# Patient Record
Sex: Female | Born: 1955 | Race: White | Hispanic: No | Marital: Married
Health system: Southern US, Community
[De-identification: ages and names within clinical notes are randomized; demographics above are authoritative.]

## PROBLEM LIST (undated history)

## (undated) DIAGNOSIS — E119 Type 2 diabetes mellitus without complications: Secondary | ICD-10-CM

---

## 2001-02-17 ENCOUNTER — Other Ambulatory Visit: Admission: RE | Admit: 2001-02-17 | Discharge: 2001-02-17 | Payer: Self-pay | Admitting: Obstetrics and Gynecology

## 2001-03-15 ENCOUNTER — Other Ambulatory Visit: Admission: RE | Admit: 2001-03-15 | Discharge: 2001-03-15 | Payer: Self-pay | Admitting: Obstetrics and Gynecology

## 2001-03-15 ENCOUNTER — Encounter (INDEPENDENT_AMBULATORY_CARE_PROVIDER_SITE_OTHER): Payer: Self-pay | Admitting: Specialist

## 2003-07-23 ENCOUNTER — Other Ambulatory Visit: Admission: RE | Admit: 2003-07-23 | Discharge: 2003-07-23 | Payer: Self-pay | Admitting: Obstetrics and Gynecology

## 2005-02-23 ENCOUNTER — Observation Stay: Payer: Self-pay | Admitting: Internal Medicine

## 2005-06-04 ENCOUNTER — Ambulatory Visit: Payer: Self-pay | Admitting: Specialist

## 2005-06-05 ENCOUNTER — Ambulatory Visit: Payer: Self-pay | Admitting: Specialist

## 2005-06-29 ENCOUNTER — Ambulatory Visit: Payer: Self-pay | Admitting: Specialist

## 2005-10-27 ENCOUNTER — Ambulatory Visit: Payer: Self-pay | Admitting: Obstetrics and Gynecology

## 2005-12-30 ENCOUNTER — Ambulatory Visit: Payer: Self-pay | Admitting: Unknown Physician Specialty

## 2007-10-27 ENCOUNTER — Ambulatory Visit: Payer: Self-pay | Admitting: Internal Medicine

## 2008-05-31 ENCOUNTER — Ambulatory Visit: Payer: Self-pay | Admitting: Gastroenterology

## 2008-11-19 ENCOUNTER — Observation Stay: Payer: Self-pay | Admitting: Vascular Surgery

## 2009-11-12 ENCOUNTER — Ambulatory Visit: Payer: Self-pay | Admitting: Gastroenterology

## 2009-11-12 DIAGNOSIS — E669 Obesity, unspecified: Secondary | ICD-10-CM | POA: Insufficient documentation

## 2009-11-12 DIAGNOSIS — N200 Calculus of kidney: Secondary | ICD-10-CM | POA: Insufficient documentation

## 2009-11-12 DIAGNOSIS — K5900 Constipation, unspecified: Secondary | ICD-10-CM | POA: Insufficient documentation

## 2009-11-12 DIAGNOSIS — R1032 Left lower quadrant pain: Secondary | ICD-10-CM | POA: Insufficient documentation

## 2009-11-12 DIAGNOSIS — M129 Arthropathy, unspecified: Secondary | ICD-10-CM | POA: Insufficient documentation

## 2009-11-12 DIAGNOSIS — K625 Hemorrhage of anus and rectum: Secondary | ICD-10-CM | POA: Insufficient documentation

## 2009-11-12 LAB — CONVERTED CEMR LAB
ALT: 12 units/L (ref 0–35)
Alkaline Phosphatase: 51 units/L (ref 39–117)
Basophils Absolute: 0 10*3/uL (ref 0.0–0.1)
Bilirubin, Direct: 0 mg/dL (ref 0.0–0.3)
CO2: 29 meq/L (ref 19–32)
Chloride: 106 meq/L (ref 96–112)
Creatinine, Ser: 0.9 mg/dL (ref 0.4–1.2)
Eosinophils Relative: 6.9 % — ABNORMAL HIGH (ref 0.0–5.0)
Hemoglobin: 12.8 g/dL (ref 12.0–15.0)
Lymphocytes Relative: 30.3 % (ref 12.0–46.0)
Monocytes Relative: 6.9 % (ref 3.0–12.0)
Neutro Abs: 3.1 10*3/uL (ref 1.4–7.7)
RDW: 13.2 % (ref 11.5–14.6)
Saturation Ratios: 15.7 % — ABNORMAL LOW (ref 20.0–50.0)
Sodium: 142 meq/L (ref 135–145)
Total Protein: 7 g/dL (ref 6.0–8.3)
Transferrin: 294.9 mg/dL (ref 212.0–360.0)
WBC: 5.6 10*3/uL (ref 4.5–10.5)

## 2009-11-19 ENCOUNTER — Ambulatory Visit: Payer: Self-pay | Admitting: Cardiovascular Disease

## 2009-12-03 ENCOUNTER — Ambulatory Visit: Payer: Self-pay | Admitting: Gastroenterology

## 2009-12-23 ENCOUNTER — Ambulatory Visit: Payer: Self-pay | Admitting: Gastroenterology

## 2009-12-24 ENCOUNTER — Telehealth: Payer: Self-pay | Admitting: Gastroenterology

## 2010-06-19 ENCOUNTER — Ambulatory Visit: Payer: Self-pay | Admitting: Unknown Physician Specialty

## 2010-07-09 ENCOUNTER — Encounter: Payer: Self-pay | Admitting: Internal Medicine

## 2010-07-09 ENCOUNTER — Ambulatory Visit: Payer: Self-pay | Admitting: Gastroenterology

## 2010-07-10 ENCOUNTER — Ambulatory Visit: Payer: Self-pay | Admitting: Internal Medicine

## 2010-07-10 DIAGNOSIS — J984 Other disorders of lung: Secondary | ICD-10-CM

## 2010-07-10 DIAGNOSIS — T17908A Unspecified foreign body in respiratory tract, part unspecified causing other injury, initial encounter: Secondary | ICD-10-CM | POA: Insufficient documentation

## 2010-07-10 DIAGNOSIS — R05 Cough: Secondary | ICD-10-CM

## 2011-01-06 NOTE — Assessment & Plan Note (Signed)
Summary: Pulmonary consultation/ cough p peanut asp   Visit Type:  Initial Consult Copy to:  Self Primary Provider/Referring Provider:  none   History of Present Illness: 47 yowf never smoked with passive exposure to cigarettes and lots of bronchitis as child but outgrew it around 17 and fine since then until ? asp peanut.  July 10, 2010  1st pulmonary office eval cc cough acute onset  7/13 after trying to eat chocolate covered peanut  while riding in car back from church and thought she coughed it up but couldn't use voice and every time she does starts coughing again.  Eval by ENT Jenne [facility] and looked at throat saw red throat rx with 10 days augment and 10 days of prednsione > some better,  still much worse with voice use.  no noct symptoms at all.  Pt denies any significant sore throat, dysphagia, itching, sneezing,  nasal congestion or excess secretions,  fever, chills, sweats, unintended wt loss, pleuritic or exertional cp, hempoptysis, change in activity tolerance  orthopnea pnd or leg swelling.  coughs to point of loosing voice.  Current Medications (verified): 1)  Estradiol 2 Mg Tabs (Estradiol) .Marland Kitchen.. 1 Once Daily 2)  Ibuprofen 800 Mg Tabs (Ibuprofen) .... Three Times A Day Prn 3)  Tessalon Perles 100 Mg Caps (Benzonatate) .Marland Kitchen.. 1 Every 8 Hrs As Needed 4)  Zinc 50 Mg Tabs (Zinc) .Marland Kitchen.. 1 Two Times A Day 5)  Vitamin C 500 Mg Tabs (Ascorbic Acid) .Marland Kitchen.. 1 Two Times A Day 6)  L-Lysine 500 Mg Tabs (Lysine) .Marland Kitchen.. 1 Two Times A Day  Allergies (verified): 1)  ! Codeine  Past History:  Past Medical History:  FOREIGN BODY, ASPIRATION (ICD-934.9) PULMONARY NODULE (ICD-518.89)    - CT chest Erwin NAD 07/09/10 RECTAL BLEEDING (ICD-569.3) CONSTIPATION (ICD-564.00) ABDOMINAL PAIN, LEFT LOWER QUADRANT (ICD-789.04) OBESITY (ICD-278.00) ARTHRITIS (ICD-716.90) RENAL CALCULUS (ICD-592.0)  Family History: Family History of Breast Cancer:Mother Family History of Colon Cancer:Maternal  Grandfaher Family History of Diabetes: Mother Family History of Heart Disease: Father Lymphoma- Mother  Review of Systems       The patient complains of shortness of breath with activity, non-productive cough, and loss of appetite.  The patient denies shortness of breath at rest, productive cough, coughing up blood, chest pain, irregular heartbeats, acid heartburn, indigestion, weight change, abdominal pain, difficulty swallowing, sore throat, tooth/dental problems, headaches, nasal congestion/difficulty breathing through nose, sneezing, itching, ear ache, anxiety, depression, hand/feet swelling, joint stiffness or pain, rash, change in color of mucus, and fever.    Vital Signs:  Patient profile:   55 year old female Weight:      211.50 pounds O2 Sat:      97 % on Room air Temp:     98.0 degrees F oral Pulse rate:   74 / minute BP sitting:   110 / 70  (left arm)  Vitals Entered By: Vernie Murders (July 10, 2010 1:58 PM)  O2 Flow:  Room air  Physical Exam  Additional Exam:  wt 206 > 211 July 10, 2010  obese hoarse wf with frequent throat clearing and pseudowheeze resolves with purse lip maneuver  HEENT: nl dentition, turbinates, and orophanx. Nl external ear canals without cough reflex NECK :  without JVD/Nodes/TM/ nl carotid upstrokes bilaterally LUNGS: no acc muscle use, clear to A and P bilaterally without cough on insp or exp maneuvers CV:  RRR  no s3 or murmur or increase in P2, no edema  ABD:  soft and  nontender with nl excursion in the supine position. No bruits or organomegaly, bowel sounds nl MS:  warm without deformities, calf tenderness, cyanosis or clubbing SKIN: warm and dry without lesions   NEURO:  alert, approp, no deficits     CXR  Procedure date:  07/09/2010  Findings:      Reviewed:  NAD  Impression & Recommendations:  Problem # 1:  COUGH (ICD-786.2)  Of the three most common causes of chronic cough, only one (GERD) can actually cause the other two  (asthma and pnds) and perpetuate the cylce of cough inducing airway trauma, inflammation, heightened sensitivity to reflux which is prompted by the cough itself via a cyclical mechanism.  This may partially respond to steroids and look like asthma and post nasal drainage but never erradicated completely unless the cough and the secondary reflux are eliminated, preferably both at the same time.   See instructions for specific recommendations  - if not better then fob to r/o retained fb, but this is very unlikely given absence of findings to support this dx by exam and absence of symptoms x when uses voice (no noct or early am cough or wheeze)  Orders: Consultation Level V ([address]) New Patient Level V ([address])  Medications Added to Medication List This Visit: 1)  Estradiol 2 Mg Tabs (Estradiol) .Marland Kitchen.. 1 once daily 2)  Tessalon Perles 100 Mg Caps (Benzonatate) .Marland Kitchen.. 1 every 8 hrs as needed 3)  Zinc 50 Mg Tabs (Zinc) .Marland Kitchen.. 1 two times a day 4)  Vitamin C 500 Mg Tabs (Ascorbic acid) .Marland Kitchen.. 1 two times a day 5)  L-lysine 500 Mg Tabs (Lysine) .Marland Kitchen.. 1 two times a day 6)  Tramadol Hcl 50 Mg Tabs (Tramadol hcl) .... One to two by mouth every 4-6 hours 7)  Prednisone 10 Mg Tabs (Prednisone) .... 4 each am x 2days, 2x2days, 1x2days and stop  Patient Instructions: 1)  Prednisone  10mg  4 each am x 2days, 2x2days, 1x2days and stop  2)  Take delsym two tsp every 12 hours and add tramadol 50 mg up to every 4 hours to suppress the urge to cough. Swallowing water or using ice chips/non mint and menthol containing candies (such as lifesavers or sugarless jolly ranchers) are also effective.  3)  Acid reflux is a leading suspect here and needs to be eliminated  completely before considering additional studies or treatment options. To suppress this maximally, take dexilant 60 mg 30 min before first meal and pepcid 20 mg (otc) at bedtime plus diet measures as listed in flyer 4)  Call me August 8th to schedule bronchoscopy if  not better Prescriptions: PREDNISONE 10 MG  TABS (PREDNISONE) 4 each am x 2days, 2x2days, 1x2days and stop  #14 x 0   Entered and Authorized by:   Nyoka Cowden MD   Signed by:   Nyoka Cowden MD on 07/10/2010   Method used:   Electronically to        [facility] [phone]* (retail)       [address] [place], [place]  [address]       Ph: [phone]       Fax: [phone]   RxID:   [id number] TRAMADOL HCL 50 MG  TABS (TRAMADOL HCL) One to two by mouth every 4-6 hours  #40 x 0   Entered and Authorized by:   Nyoka Cowden MD   Signed by:   Nyoka Cowden MD on  07/10/2010   Method used:   Electronically to        [facility] [phone]* (retail)       [address] [place], [place]  [address]       Ph: [phone]       Fax: [phone]   RxID:   [id number]

## 2011-01-06 NOTE — Procedures (Signed)
Summary: Colonoscopy  Patient: Nyasha Rahilly Note: All result statuses are Final unless otherwise noted.  Tests: (1) Colonoscopy (COL)   COL Colonoscopy           DONE     [facility]     [address]     [place], [place]  [address]           COLONOSCOPY PROCEDURE REPORT           PATIENT:  Shravya, Wickwire  MR#:  [id number]     BIRTHDATE:  1956-09-16, 53 yrs. old  GENDER:  female           ENDOSCOPIST:  Vania Rea. Jarold Motto, MD, [facility]     Referred by:           PROCEDURE DATE:  12/23/2009     PROCEDURE:  Colonoscopy, Diagnostic, Colon w/ inj sclerosis of     hemorrhoids     ASA CLASS:  Class II     INDICATIONS:  abdominal pain, hematochezia           MEDICATIONS:   Fentanyl 50 mcg IV, Versed 7 mg IV           DESCRIPTION OF PROCEDURE:   After the risks benefits and     alternatives of the procedure were thoroughly explained, informed     consent was obtained.  Digital rectal exam was performed and     revealed no abnormalities and perianal skin tags.   The LB     [id number] endoscope was introduced through the anus and     advanced to the cecum, which was identified by both the appendix     and ileocecal valve, without limitations.  The quality of the prep     was Moviprep fair.  The instrument was then slowly withdrawn as     the colon was fully examined.     <<PROCEDUREIMAGES>>           FINDINGS:  ENDOSCOPIC FINDINGS:   Internal hemorrhoids were found.     CC OF HYPERTONIC SALINE INJECTED ABOVE THE Z-LINE. No polyps or     cancers were seen.  This was otherwise a normal examination of the     colon.   Retroflexed views in the rectum revealed hemorrhoids.     The scope was then withdrawn from the patient and the procedure     completed.           COMPLICATIONS:  None           ENDOSCOPIC IMPRESSION:     1) No polyps or cancers     2) Internal hemorrhoids     3) Hemorrhoids     CHRONIC ABDOMINAL PAIN.?? ETIOLOGY.NORMAL CT SCAN AND     COLONOSCOPY  RECOMMENDATIONS:     1.ANNUSOL HC SUPP BID FOR 1 WEEK.     2.GYN F/U DR. Melinda Crutch OF ENDOMETRIOSIS AND TAH.PROBABLY NEEDS     LAPROSCOPY EXAM.     3. THIS IS HER SECOND COLONOSCOPY IN A YEAR.NOFURTHER GI W/U     INDICATED.           REPEAT EXAM:  No           ______________________________     Vania Rea. Jarold Motto, MD, [facility]           CC:           n.     eSIGNED:   Vania Rea. Patterson at 12/23/2009 12:31 PM  [place], [id number]  Note: An exclamation mark (!) indicates a result that was not dispersed into the flowsheet. Document Creation Date: 12/23/2009 12:31 PM _______________________________________________________________________  (1) Order result status: Final Collection or observation date-time: 12/23/2009 12:15 Requested date-time:  Receipt date-time:  Reported date-time:  Referring Physician:   Ordering Physician: Sheryn Bison [id number]) Specimen Source:  Source: Launa Grill Order Number: [id number] Lab site:

## 2011-01-06 NOTE — Progress Notes (Signed)
Summary: triage  Phone Note Other Incoming Call back at [phone]   Caller: Patient Call For: Jarold Motto Reason for Call: Talk to Nurse Summary of Call: Patient states that her stomach feels really tight wants to know what to do had colon yesterday. Initial call taken by: Tawni Levy,  December 24, 2009 2:31 PM  Follow-up for Phone Call        pt. c/o abd. being tight today "like pregnant". Not as painful as yesterday but uncomfortable ( 2 on pain scale) . has eaten cereal and ham sandwich today and has passed some flatus but not much. went over all the suggestions for warm fluids ,walking etc. to releive gas discomfort with pt. and pt. says she has done these things and has taken gas x tablets also. called and informed Lupita Leash ,Dr. Norval Gable nurse to let Dr. Jarold Motto know pt's c/os.   this he is a chronic problem and is not acute. She should continue to use p.r.n. Bentyl Follow-up by: Alease Frame RN,  December 24, 2009 2:51 PM  Additional Follow-up for Phone Call Additional follow up Details #1::        as needed bentyl Additional Follow-up by: Mardella Layman MD FACG,  December 24, 2009 4:11 PM     Appended Document: QI Follow-UP    Phone Note Outgoing Call   Call placed by: Elmon Kirschner RN,  January 16, 2010 9:53 AM Details for Reason: Pain Complaint QI follow up. Summary of Call: Patient had complaint of RUQ pain post-procedure in recovery (had hemorrhoidal injection - rated RUQ abd. pain from 9 down to 5 before D/C).  She related that she was very dissappointed that no one returned her calls.  The only documented call was from 12/24/2009, she called with c/o abd. being tight, "like pregnant" which she rated as a 2 on the pain scale. The call was followed up by Alease Frame, RN who went over all of the "routine" suggestions to relieve gas discomfort. Pt said she had done all of those things and taken GAS-X tabs as well without relief. Boyd Kerbs referred the c/o to [facility] 3rd  floor to relate to Dr. Jarold Motto.  According to phone note Dr. Jarold Motto said that her c/o was a chronic problem and she should continue Bentyl. There is no documentation of anyone following up with a phone call to the patient.  The patient related that she had a colonoscopy previously in [place] by Dr. Daleen Squibb and had no problems post-procedure.  She stated she had a rough week following this colonoscopy and had to manage her care all on her own because no one returned her calls.  Today (2/10) she reports her pain has resolved, she attributes this to taking Senna daily for her c/o constipation which allows her to have BM every 2-3 days.  Follow-up for Phone Call        Noted.  Please see Lupita Leash re this. Follow-up by: Ashok Cordia RN,  February 06, 2010 10:17 AM  Additional Follow-up for Phone Call Additional follow up Details #1::        noted....needs gyn f/u in future if she calls....  Additional Follow-up by: Mardella Layman MD FACG,  February 24, 2010 9:03 AM    Additional Follow-up for Phone Call Additional follow up Details #2::    nancy, please see Lupita Leash about this note. Follow-up by: Ashok Cordia RN,  February 25, 2010 4:21 PM

## 2011-01-06 NOTE — Miscellaneous (Signed)
Summary: RR med  Clinical Lists Changes  Medications: Added new medication of ANUSOL-HC 25 MG  SUPP (HYDROCORTISONE ACETATE) Use rectally twice daily as needed for hemorrhoid relief - Signed Rx of ANUSOL-HC 25 MG  SUPP (HYDROCORTISONE ACETATE) Use rectally twice daily as needed for hemorrhoid relief;  #30 x 3;  Signed;  Entered by: Clide Cliff RN;  Authorized by: Mardella Layman MD [facility];  Method used: Electronically to [facility] [phone]*, [address], [place], [place]  [address], Ph: [phone], Fax: [phone] Observations: Added new observation of ALLERGY REV: Done (12/23/2009 12:39)    Prescriptions: ANUSOL-HC 25 MG  SUPP (HYDROCORTISONE ACETATE) Use rectally twice daily as needed for hemorrhoid relief  #30 x 3   Entered by:   Clide Cliff RN   Authorized by:   Mardella Layman MD [facility]   Signed by:   Clide Cliff RN on 12/23/2009   Method used:   Electronically to        [facility] [phone]* (retail)       [address] [place], [place]  [address]       Ph: [phone]       Fax: [phone]   RxID:   [id number]

## 2011-11-07 ENCOUNTER — Ambulatory Visit: Payer: Self-pay | Admitting: Oncology

## 2011-11-24 ENCOUNTER — Ambulatory Visit: Payer: Self-pay | Admitting: Gastroenterology

## 2011-11-25 ENCOUNTER — Ambulatory Visit: Payer: Self-pay | Admitting: Oncology

## 2011-11-26 ENCOUNTER — Ambulatory Visit: Payer: Self-pay | Admitting: Gastroenterology

## 2011-11-30 LAB — PATHOLOGY REPORT

## 2011-12-08 ENCOUNTER — Ambulatory Visit: Payer: Self-pay | Admitting: Oncology

## 2011-12-16 LAB — PROTIME-INR: Prothrombin Time: 29.2 secs — ABNORMAL HIGH (ref 11.5–14.7)

## 2011-12-23 LAB — PROTIME-INR: INR: 3.1

## 2011-12-30 LAB — PROTIME-INR: Prothrombin Time: 31.4 secs — ABNORMAL HIGH (ref 11.5–14.7)

## 2012-01-08 ENCOUNTER — Ambulatory Visit: Payer: Self-pay | Admitting: Oncology

## 2012-01-21 LAB — PROTIME-INR: Prothrombin Time: 34 secs — ABNORMAL HIGH (ref 11.5–14.7)

## 2012-02-05 ENCOUNTER — Ambulatory Visit: Payer: Self-pay | Admitting: Oncology

## 2012-02-24 LAB — PROTIME-INR
INR: 3.4
Prothrombin Time: 34.3 secs — ABNORMAL HIGH (ref 11.5–14.7)

## 2012-03-02 LAB — PROTIME-INR: INR: 1.2

## 2012-03-07 ENCOUNTER — Ambulatory Visit: Payer: Self-pay | Admitting: Oncology

## 2012-03-16 ENCOUNTER — Ambulatory Visit: Payer: Self-pay | Admitting: Oncology

## 2012-03-16 LAB — COMPREHENSIVE METABOLIC PANEL
Albumin: 3.8 g/dL (ref 3.4–5.0)
Alkaline Phosphatase: 75 U/L (ref 50–136)
BUN: 21 mg/dL — ABNORMAL HIGH (ref 7–18)
Creatinine: 0.95 mg/dL (ref 0.60–1.30)
SGOT(AST): 15 U/L (ref 15–37)
Total Protein: 7 g/dL (ref 6.4–8.2)

## 2012-03-16 LAB — CBC CANCER CENTER
Basophil #: 0 x10 3/mm (ref 0.0–0.1)
HCT: 38.6 % (ref 35.0–47.0)
HGB: 12.6 g/dL (ref 12.0–16.0)
Lymphocyte %: 29.6 %
MCHC: 32.6 g/dL (ref 32.0–36.0)
MCV: 86 fL (ref 80–100)
Monocyte %: 7.5 %
Neutrophil #: 1.7 x10 3/mm (ref 1.4–6.5)
Platelet: 218 x10 3/mm (ref 150–440)
RBC: 4.52 10*6/uL (ref 3.80–5.20)
RDW: 15.6 % — ABNORMAL HIGH (ref 11.5–14.5)
WBC: 3.5 x10 3/mm — ABNORMAL LOW (ref 3.6–11.0)

## 2012-04-06 ENCOUNTER — Ambulatory Visit: Payer: Self-pay | Admitting: Oncology

## 2013-04-28 IMAGING — CT CT ABD-PELV W/ CM
1 of 2 series · 15 of 32 positions shown, 19 images · IV contrast (isovue)
Comparison: none

REASON FOR EXAM: STAT CR [id number]  abd pain nausea LLQ pain  eval acute
diverticulitis eval...
COMMENTS:

PROCEDURE:     CT  - CT ABDOMEN / PELVIS  W  - November 24, 2011  [DATE]
RESULT:     Comparison:  11/19/2008
TECHNIQUE: Multiple axial images of the abdomen and pelvis were performed
from the lung bases to the pubic symphysis, with p.o. contrast and with 85
mL of Isovue 370 intravenous contrast.

[Series 2: 3mm soft tissue · axial · 0.71mm/px · z∈[-468,-40]mm · 15 of 157 slices shown, 19 images]
[im 7/157  soft-tissue]
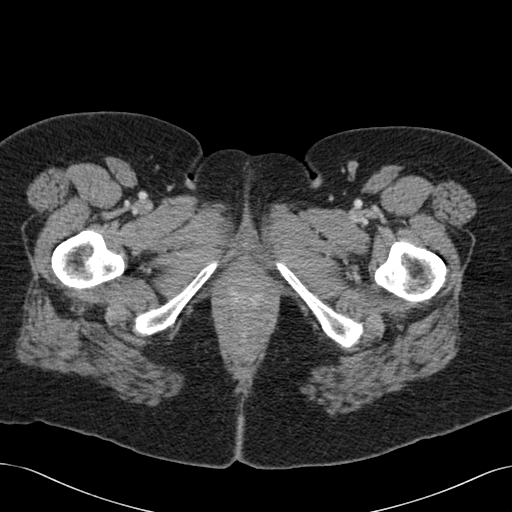
[im 7/157  bone]
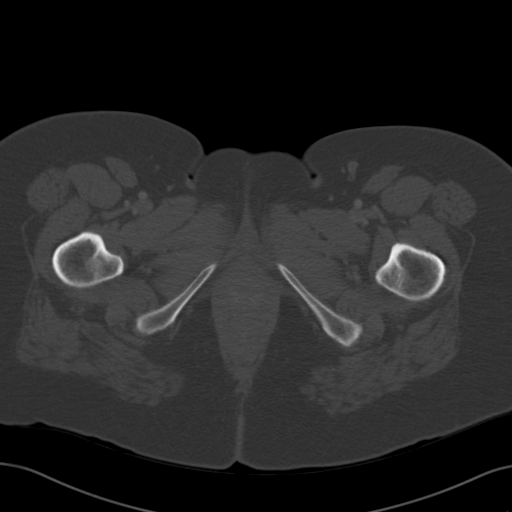
[im 20/157  soft-tissue]
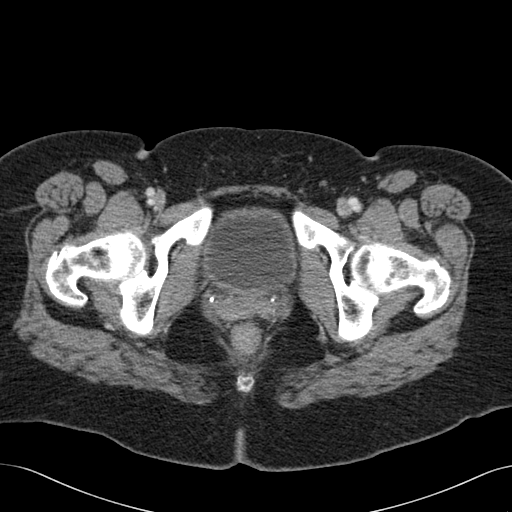
[im 33/157  soft-tissue]
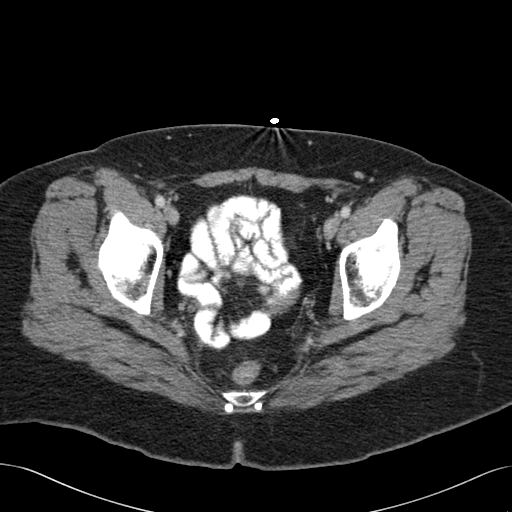
[im 46/157  soft-tissue]
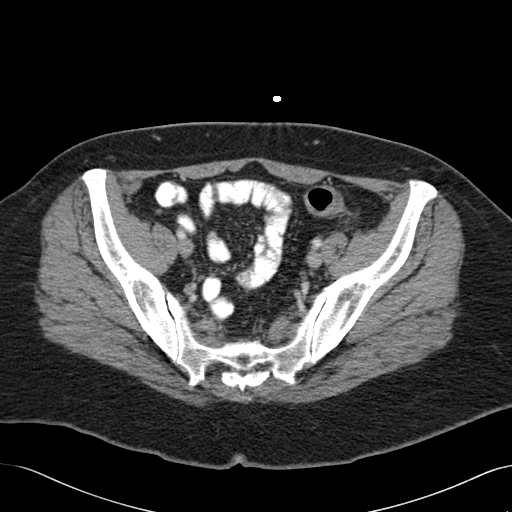
[im 53/157  soft-tissue]
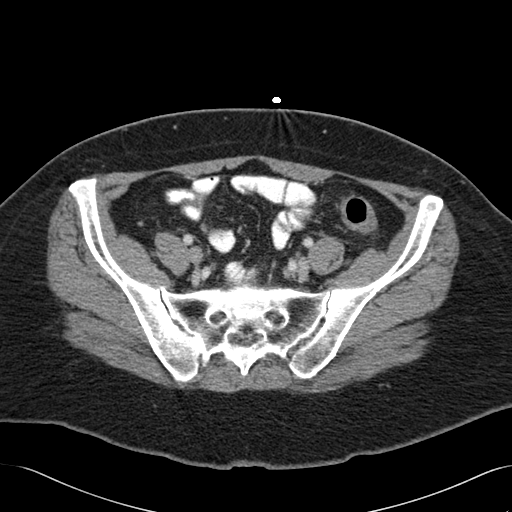
[im 66/157  soft-tissue]
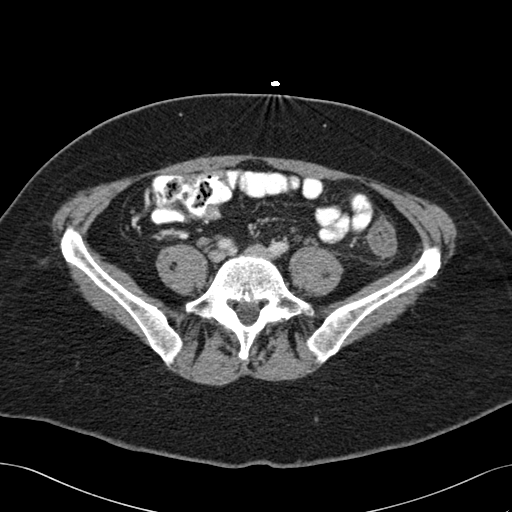
[im 79/157  soft-tissue]
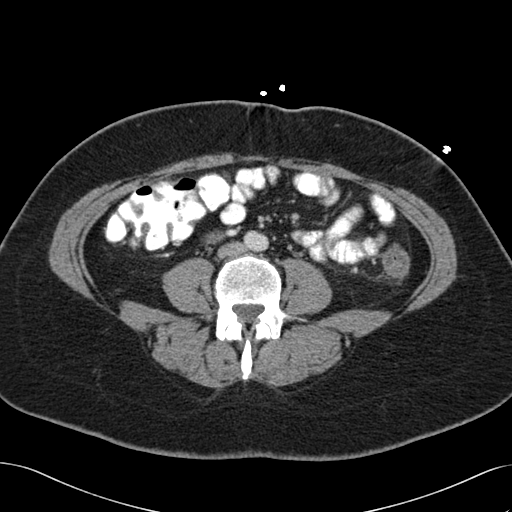
[im 92/157  soft-tissue]
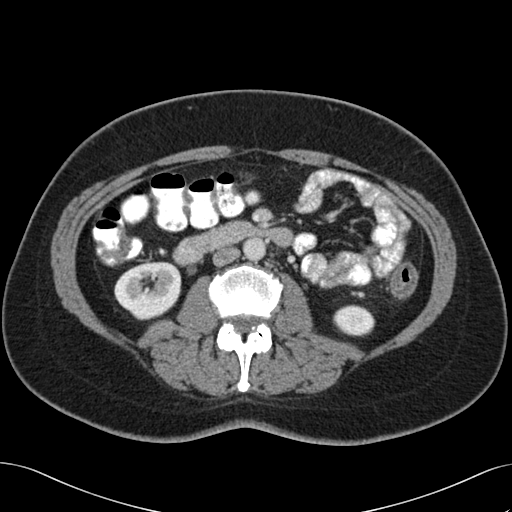
[im 105/157  soft-tissue]
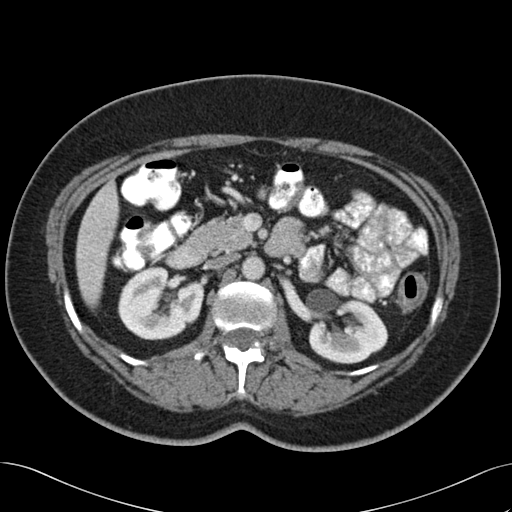
[im 105/157  bone]
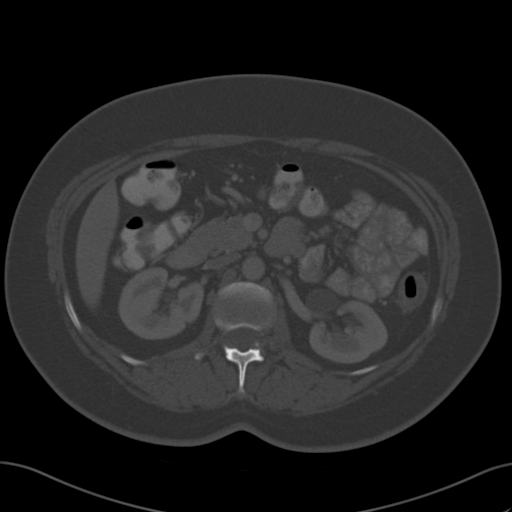
[im 111/157  soft-tissue]
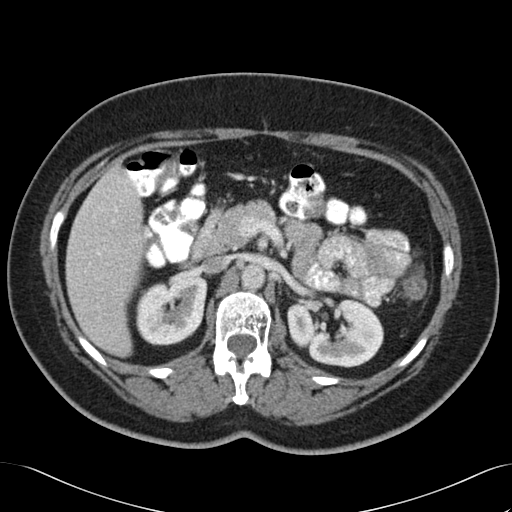
[im 124/157  soft-tissue]
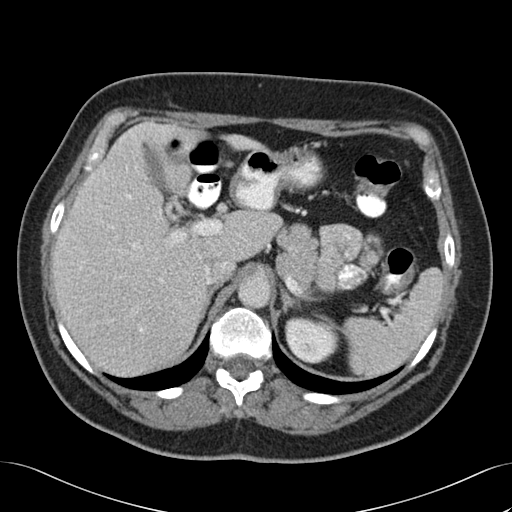
[im 131/157  lung]
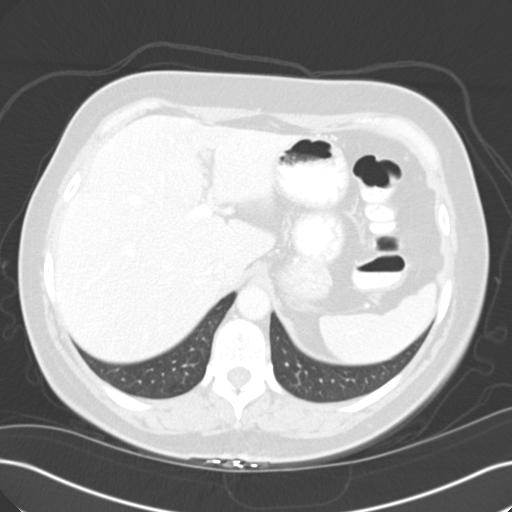
[im 137/157  soft-tissue]
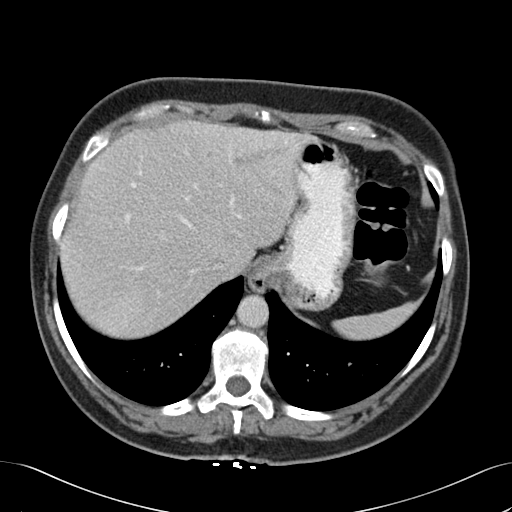
[im 137/157  lung]
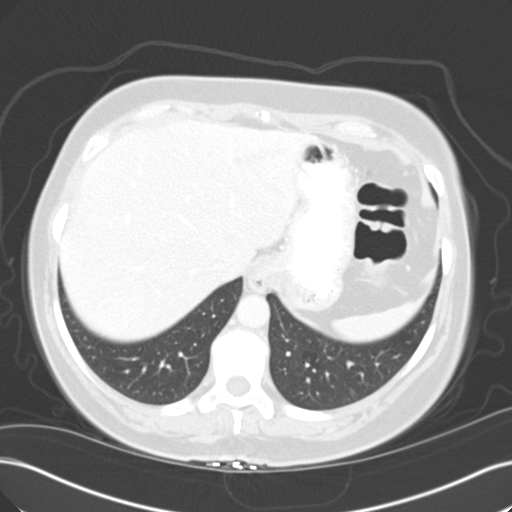
[im 144/157  lung]
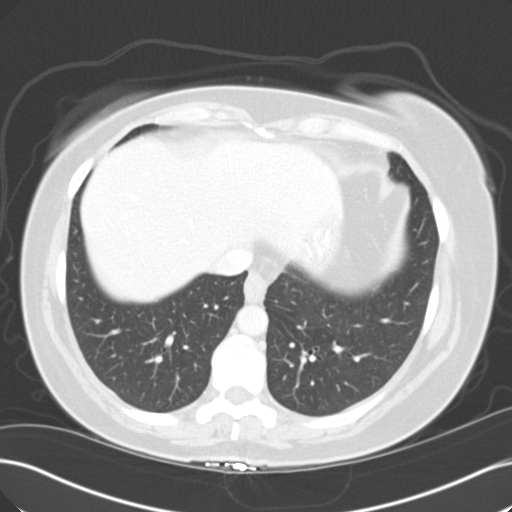
[im 150/157  soft-tissue]
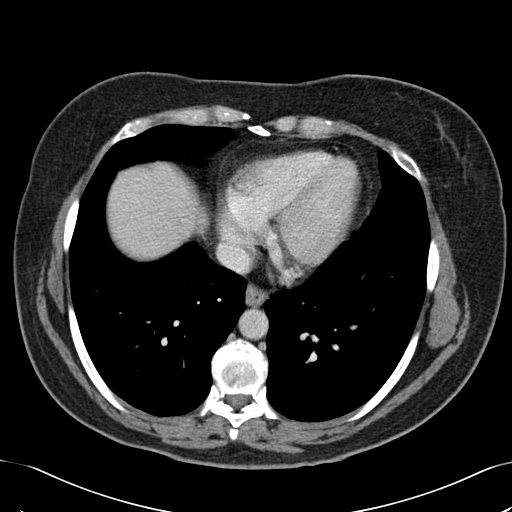
[im 150/157  lung]
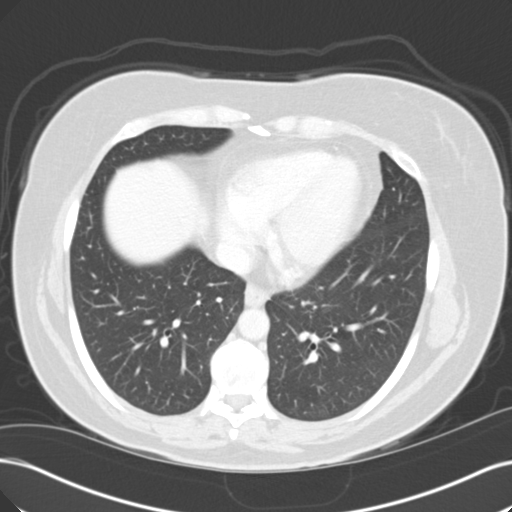

[15 of 32 positions shown; findings below may reference images not displayed]

FINDINGS: The left portal vein is filled with low attenuation and there is branching
low-attenuation in the expected location of the left portal vein. Findings
are concerning for thrombosis of the left portal vein. Minimal
low-attenuation along the falciform ligament may be related to focal fatty
deposition versus perfusional changes related to the left portal vein
thrombosis. Triangular area of increased attenuation in the right hepatic
lobe likely secondary to perfusion anomaly. The spleen, adrenals, and
gallbladder are unremarkable. The kidneys and pancreas are unremarkable.

The small and large bowel are normal in caliber. There is circumferential
bowel wall thickening of the descending colon, with mild adjacent stranding.
Prominence of the bowel wall of the sigmoid colon is felt to be secondary to
underdistention. The appendix is normal. No extraluminal fluid collections
identified. The patient is status post hysterectomy.

No aggressive lytic or sclerotic osseous lesions are identified.
IMPRESSION: 1. Colitis involving the descending colon. Differential includes infectious,
inflammatory, and ischemic etiologies.
2. Thrombosis of the left portal vein, new from prior. The etiology of this
thrombosis is not identified on this study. The left portal vein appears
expanded with low-attenuation material, suggesting this is an acute
thrombosis. Followup CT or MRI is suggested.

This was called to Laaouina Tiger at 3393 hours 11/24/2011.

## 2014-02-19 DIAGNOSIS — R079 Chest pain, unspecified: Secondary | ICD-10-CM | POA: Insufficient documentation

## 2014-02-19 DIAGNOSIS — I8290 Acute embolism and thrombosis of unspecified vein: Secondary | ICD-10-CM | POA: Insufficient documentation

## 2014-02-19 DIAGNOSIS — F32A Depression, unspecified: Secondary | ICD-10-CM | POA: Insufficient documentation

## 2014-02-19 DIAGNOSIS — F329 Major depressive disorder, single episode, unspecified: Secondary | ICD-10-CM | POA: Insufficient documentation

## 2015-03-08 DIAGNOSIS — M4726 Other spondylosis with radiculopathy, lumbar region: Secondary | ICD-10-CM | POA: Insufficient documentation

## 2015-04-05 DIAGNOSIS — M461 Sacroiliitis, not elsewhere classified: Secondary | ICD-10-CM | POA: Insufficient documentation

## 2015-08-01 ENCOUNTER — Encounter: Payer: Self-pay | Admitting: Gastroenterology

## 2016-08-17 ENCOUNTER — Encounter: Payer: Self-pay | Admitting: Podiatry

## 2016-08-17 ENCOUNTER — Ambulatory Visit (INDEPENDENT_AMBULATORY_CARE_PROVIDER_SITE_OTHER): Payer: Managed Care, Other (non HMO) | Admitting: Podiatry

## 2016-08-17 ENCOUNTER — Ambulatory Visit (INDEPENDENT_AMBULATORY_CARE_PROVIDER_SITE_OTHER): Payer: Managed Care, Other (non HMO)

## 2016-08-17 DIAGNOSIS — M7752 Other enthesopathy of left foot: Secondary | ICD-10-CM | POA: Diagnosis not present

## 2016-08-17 DIAGNOSIS — M79672 Pain in left foot: Secondary | ICD-10-CM

## 2016-08-17 DIAGNOSIS — M778 Other enthesopathies, not elsewhere classified: Secondary | ICD-10-CM

## 2016-08-17 DIAGNOSIS — M779 Enthesopathy, unspecified: Secondary | ICD-10-CM

## 2016-08-17 MED ORDER — MELOXICAM 15 MG PO TABS: 15.0000 mg | ORAL_TABLET | Freq: Every day | ORAL | 3 refills | Status: DC

## 2016-08-17 MED ORDER — METHYLPREDNISOLONE 4 MG PO TBPK: ORAL_TABLET | ORAL | 0 refills | Status: DC

## 2016-08-17 NOTE — Progress Notes (Signed)
She presents today as a new patient with a chief complaint of pain to the left forefoot concerned that it may possibly be a neuroma since June 2017. She states that this seems to be getting worse she's been taking Aleve to no avail she tried heat and ice and elevating her foot. 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 She denies trauma.  Objective: Vital signs are stable she is alert and oriented 3. Pulses are palpable. Since her was intact for since was the monofilament. Deep tendon reflexes are intact and muscle strength was 5 over 5 dorsiflexion plantar flexors and inverters everters all intrinsic musculature is intact. Orthopedic evaluation of his rates all joints distal to the ankle for range of motion without crepitation. Cheney's evaluation of a straight supple well-hydrated cutis she has pain on palpation second and third metatarsophalangeal joints of the left foot at the end range of motion. Radiographs do not demonstrate any type of osseus abnormalities.  Assessment: Capsulitis second third metatarsophalangeal joints of the left foot.  Plan: I injected between the second and third metatarsals today with Kenalog and local anesthetic started her on a Medrol Dosepak to be followed by meloxicam. discussed appropriate shoe gear stretching exercises ice therapy and shoe modifications.

## 2016-09-16 ENCOUNTER — Ambulatory Visit: Payer: Managed Care, Other (non HMO) | Admitting: Podiatry

## 2017-03-01 ENCOUNTER — Ambulatory Visit (INDEPENDENT_AMBULATORY_CARE_PROVIDER_SITE_OTHER): Payer: Self-pay | Admitting: Podiatry

## 2017-03-01 ENCOUNTER — Encounter: Payer: Self-pay | Admitting: Podiatry

## 2017-03-01 DIAGNOSIS — M7752 Other enthesopathy of left foot: Secondary | ICD-10-CM

## 2017-03-01 DIAGNOSIS — M778 Other enthesopathies, not elsewhere classified: Secondary | ICD-10-CM

## 2017-03-01 DIAGNOSIS — M779 Enthesopathy, unspecified: Principal | ICD-10-CM

## 2017-03-01 DIAGNOSIS — M79673 Pain in unspecified foot: Secondary | ICD-10-CM

## 2017-03-01 NOTE — Progress Notes (Signed)
She presents today for follow-up of her capsulitis second metatarsophalangeal joint of the left foot. States that he was doing very well for quite some time now has began to hurt again and she is noticing it definitive shift of the second toe medially toward the hallux. She states it is painful in the toes feels like it is starting to contract.  Objective: Vital signs are stable she is alert and oriented 3 she has pain on end range of motion the second toe which appears to sit somewhat contracted dorsally at the level of the metatarsophalangeal joint and medially displaced. She has ability of full plantar flexion. Some medial dislocation is noted.  Assessment: Pre-dislocation syndrome with capsulitis.  Plan: Inject a small amount of dexamethasone plantarly today with local anesthetic. Placed her in a plantar flexory digital brace and will follow up with her in 3-4 weeks at which time we may need to discuss surgical intervention and/or orthotics.

## 2017-03-22 ENCOUNTER — Ambulatory Visit (INDEPENDENT_AMBULATORY_CARE_PROVIDER_SITE_OTHER): Payer: Managed Care, Other (non HMO) | Admitting: Podiatry

## 2017-03-22 ENCOUNTER — Encounter: Payer: Self-pay | Admitting: Podiatry

## 2017-03-22 DIAGNOSIS — M778 Other enthesopathies, not elsewhere classified: Secondary | ICD-10-CM

## 2017-03-22 DIAGNOSIS — M779 Enthesopathy, unspecified: Principal | ICD-10-CM

## 2017-03-22 DIAGNOSIS — M7752 Other enthesopathy of left foot: Secondary | ICD-10-CM

## 2017-03-22 NOTE — Progress Notes (Signed)
She presents today for follow-up of her capsulitis second metatarsophalangeal joint of the left foot. She states it is beginning to affect her ability to perform her work. 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 She states that the second metatarsophalangeal joint left foot is not improving. She'll let to consider surgical intervention if that nothing else is available to have done.  Objective: Vital signs are stable she is alert and oriented 3 she has hammertoe deformity second metatarsal and joint with pain on palpation of the second metatarsophalangeal joint.  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  Plan: Discussed etiology pathology conservative or surgical therapies this point in time we consented her for surgical intervention consisting of a second metatarsal osteotomy and hammertoe repair with pinning of the toe. I will follow-up with her in the near future for surgical intervention. We discussed pros and cons of surgery as well as possible complications and may be included but not limited to postop pain bleeding swelling infection recurrence need further surgery also digit loss of limb loss of life. We discussed the surgery center and anesthesia.

## 2017-03-22 NOTE — Patient Instructions (Signed)

## 2017-05-28 ENCOUNTER — Telehealth: Payer: Self-pay | Admitting: Podiatry

## 2017-05-28 NOTE — Telephone Encounter (Signed)
Called patient back at [phone] (Cell #) regarding trying to schedule surgery with Dr. Al CorpusHyatt. Pt stated, "I talked to Dr. Al CorpusHyatt about a tentative surgery date for my foot, but after I got an injection, my foot felt better so I was not going to get surgery. Now, my foot is starting to hurt again and I want to set up surgery with Dr. Al CorpusHyatt. I called the surgical center and they told me that I needed to contact your office so you could send the paperwork in".  I told patient that Delydia was out of the office today, but I will have her call her back on Monday, May 31, 2017. Pt stated she understood.

## 2017-06-02 ENCOUNTER — Telehealth: Payer: Self-pay | Admitting: *Deleted

## 2017-06-02 NOTE — Telephone Encounter (Signed)
"  I want to schedule my surgery with Dr. Al CorpusHyatt.  I been trying to call.  I called on Friday and they said you were off."  Do you have a date in mind?  "Yes, I'd like to do it July 20 or sooner if possible."  July 20 is the earliest he can do it.  I will get it scheduled.  "Is there anything else I need to do?"  Yes, you will need to register with the surgical center, instructions are in the brochure that was given to you.  Someone from the surgical center will call you a day or two prior to surgery date with the arrival time.

## 2017-06-24 ENCOUNTER — Other Ambulatory Visit: Payer: Self-pay | Admitting: Podiatry

## 2017-06-24 MED ORDER — HYDROMORPHONE HCL 4 MG PO TABS: 4.0000 mg | ORAL_TABLET | ORAL | 0 refills | Status: AC | PRN

## 2017-06-24 MED ORDER — PROMETHAZINE HCL 25 MG PO TABS: 25.0000 mg | ORAL_TABLET | Freq: Three times a day (TID) | ORAL | 0 refills | Status: AC | PRN

## 2017-06-24 MED ORDER — CEPHALEXIN 500 MG PO CAPS: 500.0000 mg | ORAL_CAPSULE | Freq: Three times a day (TID) | ORAL | 0 refills | Status: DC

## 2017-06-25 DIAGNOSIS — M21542 Acquired clubfoot, left foot: Secondary | ICD-10-CM | POA: Diagnosis not present

## 2017-06-25 DIAGNOSIS — M2042 Other hammer toe(s) (acquired), left foot: Secondary | ICD-10-CM | POA: Diagnosis not present

## 2017-06-29 ENCOUNTER — Telehealth: Payer: Self-pay | Admitting: *Deleted

## 2017-06-29 ENCOUNTER — Telehealth: Payer: Self-pay | Admitting: Podiatry

## 2017-06-29 NOTE — Telephone Encounter (Signed)
Yes, I'm calling about my first post-op appointment from surgery on 20 July. My discharge paper said my appointment was for Wednesday 25 July at 2:45 pm but my appointment is not until Monday 30 July. I really think Dr. Al CorpusHyatt needs to look at this before the 2530 th of July. It is hurting, and it has swollen right much. If you could please call me back at [phone] which is my home number or at [phone] which is my mobile number.

## 2017-06-29 NOTE — Telephone Encounter (Signed)
Pt states she had surgery on Friday with Dr. Al CorpusHyatt and she felt her dressing was too tight. Pt left home and cell numbers and okayed to leave a message because she may be out for a while. I left message informing pt that she could remove only part of the dressing and told her to sit on the bed or sofa, remove the surgical foot gear, open-ended sock, ace wrap only, then elevate the surgical foot for 15 minutes, but if the pain worsened elevated to dangle the foot this being the only time dangling was okay, then put the foot level with the hip and rewrap the foot looser starting at the toes and going toward the leg, reapply the open-ended sock and boot. I told pt to call with concerns.

## 2017-07-05 ENCOUNTER — Ambulatory Visit (INDEPENDENT_AMBULATORY_CARE_PROVIDER_SITE_OTHER): Payer: Managed Care, Other (non HMO)

## 2017-07-05 ENCOUNTER — Ambulatory Visit (INDEPENDENT_AMBULATORY_CARE_PROVIDER_SITE_OTHER): Payer: Self-pay | Admitting: Podiatry

## 2017-07-05 ENCOUNTER — Encounter: Payer: Self-pay | Admitting: Podiatry

## 2017-07-05 DIAGNOSIS — M2042 Other hammer toe(s) (acquired), left foot: Secondary | ICD-10-CM

## 2017-07-05 DIAGNOSIS — M216X2 Other acquired deformities of left foot: Secondary | ICD-10-CM

## 2017-07-05 NOTE — Progress Notes (Signed)
She presented today for her first postop visit status post second metatarsal osteotomy hammertoe repair with screw date of surgery 06/25/2017. He states that she feels okay and she's been doing well.  Objective: Vital signs are stable she is alert and oriented 3. Pulses are palpable. Neurologic sensorium is intact. Deep tendon reflexes are intact there's mild edema no erythema cellulitis drainage or odor. Sutures are well coapted.  Assessment: Well-healing surgical foot.  Plan: Redress today dressing compressive dressing continue the use of the Cam Walker and keep this foot elevated dry sterile.

## 2017-07-07 ENCOUNTER — Encounter: Payer: Managed Care, Other (non HMO) | Admitting: Podiatry

## 2017-07-12 ENCOUNTER — Ambulatory Visit (INDEPENDENT_AMBULATORY_CARE_PROVIDER_SITE_OTHER): Payer: Managed Care, Other (non HMO) | Admitting: Podiatry

## 2017-07-12 ENCOUNTER — Encounter: Payer: Self-pay | Admitting: Podiatry

## 2017-07-12 DIAGNOSIS — M216X2 Other acquired deformities of left foot: Secondary | ICD-10-CM

## 2017-07-12 DIAGNOSIS — M2042 Other hammer toe(s) (acquired), left foot: Secondary | ICD-10-CM

## 2017-07-12 NOTE — Progress Notes (Signed)
She presents today for her second postop visit date of surgery 06/25/2017 metatarsal osteotomy second with a hammertoe repair second left she states that he really feels pretty good already get the sutures out because the itching.  Objective: Vital signs are stable she is alert and oriented 3. One small area of maceration to the distal aspect of the toe. Sutures were removed margins remain well coapted proximally.  Assessment: Well-healing surgical foot.  Plan: Rostral compressive dressing applied today. Recommended she leave this on until tomorrow and then start changing the dressing herself. She can start as is also warm water soaks daily or every other day. Follow-up with me in 1 week. Watch for signs and symptoms of infection notify us if there are any.

## 2017-07-26 ENCOUNTER — Ambulatory Visit (INDEPENDENT_AMBULATORY_CARE_PROVIDER_SITE_OTHER): Payer: Managed Care, Other (non HMO)

## 2017-07-26 ENCOUNTER — Ambulatory Visit (INDEPENDENT_AMBULATORY_CARE_PROVIDER_SITE_OTHER): Payer: Managed Care, Other (non HMO) | Admitting: Podiatry

## 2017-07-26 ENCOUNTER — Encounter: Payer: Self-pay | Admitting: Podiatry

## 2017-07-26 DIAGNOSIS — M2042 Other hammer toe(s) (acquired), left foot: Secondary | ICD-10-CM

## 2017-07-26 DIAGNOSIS — M216X2 Other acquired deformities of left foot: Secondary | ICD-10-CM

## 2017-07-26 MED ORDER — MUPIROCIN 2 % EX OINT: TOPICAL_OINTMENT | CUTANEOUS | 2 refills | Status: DC

## 2017-07-26 MED ORDER — MUPIROCIN 2 % EX OINT: TOPICAL_OINTMENT | CUTANEOUS | 2 refills | Status: AC

## 2017-07-26 NOTE — Progress Notes (Signed)
She presents today for follow-up of her hammertoe repair second left. She states that is doing pretty good she's been soaking it twice daily to try to get the wound to heal. She states that over the past few days it has really started to hurt a lot more.  Objective: Vital signs are stable alert and oriented 3. Pulses are palpable. The wound has healed in considerably to the dorsal aspect of the toe and then it still remains erythematous. Distal aspect does demonstrate a eschar which should go on to heal uneventfully. At this point I see no signs of infection. Radiographs taken today demonstrate well-healing fusion site.  Assessment: Well-healing surgical foot delayed wound healing and toe left.  Plan: I would request that she soak the wound every other day Epsom salts and warm water. Daily she will apply a small amount of Bactroban ointment which I prescribed today. She will cover lightly during the day Levaquin at bedtime. Follow-up with her in 2 weeks

## 2017-07-26 NOTE — Addendum Note (Signed)
Addended by: Kristian Covey on: 07/26/2017 04:51 PM   Modules accepted: Orders

## 2017-08-04 ENCOUNTER — Ambulatory Visit (INDEPENDENT_AMBULATORY_CARE_PROVIDER_SITE_OTHER): Payer: Managed Care, Other (non HMO) | Admitting: Podiatry

## 2017-08-04 ENCOUNTER — Encounter: Payer: Self-pay | Admitting: Podiatry

## 2017-08-04 VITALS — Temp 98.7°F

## 2017-08-04 DIAGNOSIS — M2042 Other hammer toe(s) (acquired), left foot: Secondary | ICD-10-CM

## 2017-08-04 DIAGNOSIS — M216X2 Other acquired deformities of left foot: Secondary | ICD-10-CM

## 2017-08-04 MED ORDER — DOXYCYCLINE HYCLATE 100 MG PO TABS: 100.0000 mg | ORAL_TABLET | Freq: Two times a day (BID) | ORAL | 0 refills | Status: DC

## 2017-08-04 NOTE — Progress Notes (Signed)
She presents today for follow-up of her amateur repair second digit left foot. States that is slow to heal and is a little bit tender that she restored first to the red painful toe second left. Date of surgery 06/25/2017. She states that seems to be doing a little better.  Objective: Vital signs are stable alert and oriented 3. At this point the toe still red the end of the toe has not completely healed and the eschar still present. His neck. No malodor noted drainage according to the patient.  Assessment: Cannot rule out cellulitis of the toe.  Plan: Started her on doxycycline. Follow up with her in 1-2 weeks. Anemia another set of x-rays on the same the next time she comes in.

## 2017-08-06 ENCOUNTER — Encounter: Payer: Self-pay | Admitting: *Deleted

## 2017-08-18 ENCOUNTER — Ambulatory Visit (INDEPENDENT_AMBULATORY_CARE_PROVIDER_SITE_OTHER): Payer: Managed Care, Other (non HMO)

## 2017-08-18 ENCOUNTER — Ambulatory Visit (INDEPENDENT_AMBULATORY_CARE_PROVIDER_SITE_OTHER): Payer: Self-pay | Admitting: Podiatry

## 2017-08-18 DIAGNOSIS — M2042 Other hammer toe(s) (acquired), left foot: Secondary | ICD-10-CM

## 2017-08-18 DIAGNOSIS — M216X2 Other acquired deformities of left foot: Secondary | ICD-10-CM

## 2017-08-18 DIAGNOSIS — M79673 Pain in unspecified foot: Secondary | ICD-10-CM

## 2017-08-18 NOTE — Progress Notes (Signed)
She presents today status post metatarsal osteotomy left foot with hammertoe repair second left. Date of surgery was 06/25/2017. She states this doing so much better I'm very happy with it and no longer hurts and I continue to wear her regular shoes to work. She states that she finished up her antibiotic with stomach upset but she completed it.  Objective: Vital signs are stable she is alert and oriented 3 the toe is nice and rectus it is moderately red secondary to the chronic inflammation postoperatively. The toe is slightly dorsally contracted due to scar tissue at the level of the metatarsophalangeal joint.  Assessment: Well-healing surgical foot left.  Plan: Placed her in a Darco digital splint to help pull the toe down. I also recommended that she continue to wear her regular shoes to work and wear the splint at nighttime. I will follow-up with her in 3-4 weeks.

## 2017-09-08 ENCOUNTER — Ambulatory Visit (INDEPENDENT_AMBULATORY_CARE_PROVIDER_SITE_OTHER): Payer: Self-pay | Admitting: Podiatry

## 2017-09-08 ENCOUNTER — Ambulatory Visit (INDEPENDENT_AMBULATORY_CARE_PROVIDER_SITE_OTHER): Payer: Managed Care, Other (non HMO)

## 2017-09-08 ENCOUNTER — Encounter: Payer: Self-pay | Admitting: Podiatry

## 2017-09-08 DIAGNOSIS — M2042 Other hammer toe(s) (acquired), left foot: Secondary | ICD-10-CM | POA: Diagnosis not present

## 2017-09-08 DIAGNOSIS — M216X2 Other acquired deformities of left foot: Secondary | ICD-10-CM

## 2017-09-08 NOTE — Progress Notes (Signed)
She presents today date of surgery 06/25/2017 metatarsal osteotomy second left. Hammertoe repair second left states it is doing really well she states it still swells but is doing much better.  Objective: Incision sites are gone on to heal. There is still a scab to the distal incision site that should be coming off pretty soon. Mild erythema but appears to be more due to swelling. She is good range of motion at the metatarsophalangeal joint and radiographs do not demonstrate any type of osseus abnormalities. Internal fixation is in good position.  Assessment: Well-healing surgical foot.  Plan: I will follow-up with her in 1 month.

## 2017-10-06 ENCOUNTER — Ambulatory Visit (INDEPENDENT_AMBULATORY_CARE_PROVIDER_SITE_OTHER): Payer: Managed Care, Other (non HMO) | Admitting: Podiatry

## 2017-10-06 ENCOUNTER — Ambulatory Visit (INDEPENDENT_AMBULATORY_CARE_PROVIDER_SITE_OTHER): Payer: Managed Care, Other (non HMO)

## 2017-10-06 DIAGNOSIS — M216X2 Other acquired deformities of left foot: Secondary | ICD-10-CM

## 2017-10-06 DIAGNOSIS — M2042 Other hammer toe(s) (acquired), left foot: Secondary | ICD-10-CM

## 2017-10-06 NOTE — Progress Notes (Signed)
She presents today status post metatarsal osteotomy date of surgery 06/25/2017. She states it seems that she is doing pretty well.  Objective: Vital signs are stable she is alert and oriented 3 there is no erythema cellulitis drainage or odor is some ecchymosis still present from the reactive hyperpigmentation. Otherwise she appears to be doing very well.  Assessment: Well-healing surgical foot.  Plan: Follow up with me on an as-needed basis.

## 2019-12-11 ENCOUNTER — Ambulatory Visit: Payer: 59 | Attending: Internal Medicine

## 2019-12-11 DIAGNOSIS — Z20822 Contact with and (suspected) exposure to covid-19: Secondary | ICD-10-CM

## 2019-12-13 LAB — NOVEL CORONAVIRUS, NAA: SARS-CoV-2, NAA: DETECTED — AB

## 2020-03-04 ENCOUNTER — Ambulatory Visit: Payer: 59

## 2020-04-16 ENCOUNTER — Emergency Department: Payer: 59

## 2020-04-16 ENCOUNTER — Other Ambulatory Visit
Admission: RE | Admit: 2020-04-16 | Discharge: 2020-04-16 | Disposition: A | Discharge status: Home / Self Care | Payer: 59 | Source: Ambulatory Visit | Attending: Family Medicine | Admitting: Family Medicine

## 2020-04-16 ENCOUNTER — Other Ambulatory Visit: Payer: Self-pay

## 2020-04-16 ENCOUNTER — Emergency Department
Admission: EM | Admit: 2020-04-16 | Discharge: 2020-04-16 | Disposition: A | Discharge status: Home / Self Care | Payer: 59 | Attending: Emergency Medicine | Admitting: Emergency Medicine

## 2020-04-16 DIAGNOSIS — R0602 Shortness of breath: Secondary | ICD-10-CM | POA: Diagnosis present

## 2020-04-16 DIAGNOSIS — R079 Chest pain, unspecified: Secondary | ICD-10-CM | POA: Diagnosis not present

## 2020-04-16 DIAGNOSIS — R05 Cough: Secondary | ICD-10-CM | POA: Insufficient documentation

## 2020-04-16 LAB — FIBRIN DERIVATIVES D-DIMER (ARMC ONLY): Fibrin derivatives D-dimer (ARMC): 540.55 ng/mL (FEU) — ABNORMAL HIGH (ref 0.00–499.00)

## 2020-04-16 LAB — COMPREHENSIVE METABOLIC PANEL
ALT: 11 U/L (ref 0–44)
AST: 14 U/L — ABNORMAL LOW (ref 15–41)
Albumin: 3.9 g/dL (ref 3.5–5.0)
Alkaline Phosphatase: 78 U/L (ref 38–126)
Anion gap: 9 (ref 5–15)
BUN: 13 mg/dL (ref 8–23)
CO2: 22 mmol/L (ref 22–32)
Calcium: 9.2 mg/dL (ref 8.9–10.3)
Chloride: 105 mmol/L (ref 98–111)
Creatinine, Ser: 0.82 mg/dL (ref 0.44–1.00)
GFR calc Af Amer: >60 mL/min (ref >60)
GFR calc non Af Amer: >60 mL/min (ref >60)
Glucose, Bld: 105 mg/dL — ABNORMAL HIGH (ref 70–99)
Potassium: 4.1 mmol/L (ref 3.5–5.1)
Sodium: 136 mmol/L (ref 135–145)
Total Bilirubin: 0.6 mg/dL (ref 0.3–1.2)
Total Protein: 7.7 g/dL (ref 6.5–8.1)

## 2020-04-16 LAB — PROTIME-INR
INR: 1 (ref 0.8–1.2)
Prothrombin Time: 12.6 seconds (ref 11.4–15.2)

## 2020-04-16 LAB — BRAIN NATRIURETIC PEPTIDE: B Natriuretic Peptide: 43 pg/mL (ref 0.0–100.0)

## 2020-04-16 LAB — TROPONIN I (HIGH SENSITIVITY): Troponin I (High Sensitivity): 3 ng/L (ref <18)

## 2020-04-16 MED ORDER — IOHEXOL 350 MG/ML SOLN
75.0000 mL | Freq: Once | INTRAVENOUS | Status: AC | PRN
  Administered 2020-04-16: 75 mL via INTRAVENOUS
  Filled 2020-04-16: qty 75

## 2020-04-16 NOTE — ED Provider Notes (Signed)
[facility] Emergency Department Provider Note       Time seen: ----------------------------------------- 1:36 PM on 04/16/2020 -----------------------------------------   I have reviewed the triage vital signs and the nursing notes.  HISTORY   Chief Complaint Abnormal Lab    HPI Susan Cuevas is a 64 y.o. female with a history of osteoarthritis, depression, cough, obesity, constipation, arthritis who presents to the ED for shortness of breath and right-sided chest pain that started yesterday.  She was seen at [facility] and was told her D-dimer was elevated.  Discomfort is 2 out of 10 in the right side of her chest.  History reviewed. No pertinent past medical history.  Patient Active Problem List   Diagnosis Date Noted  . Sacroiliitis (HCC) 04/05/2015  . 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 Chest pain 02/19/2014  . Depression 02/19/2014  . Thrombus 02/19/2014  . PULMONARY NODULE 07/10/2010  . COUGH 07/10/2010  . FOREIGN BODY, ASPIRATION 07/10/2010  . OBESITY 11/12/2009  . CONSTIPATION 11/12/2009  . RECTAL BLEEDING 11/12/2009  . RENAL CALCULUS 11/12/2009  . ARTHRITIS 11/12/2009  . ABDOMINAL PAIN, LEFT LOWER QUADRANT 11/12/2009    History reviewed. No pertinent surgical history.  Allergies Codeine and Sulfamethoxazole-trimethoprim  Social History Social History   Tobacco Use  . Smoking status: Unknown If Ever Smoked  . Smokeless tobacco: Never Used  Substance Use Topics  . Alcohol use: Not on file  . Drug use: Not on file    Review of Systems Constitutional: Negative for fever. Cardiovascular: Positive for chest pain Respiratory: Positive for shortness of breath Gastrointestinal: Negative for abdominal pain, vomiting and diarrhea. Musculoskeletal: Negative for back pain. Skin: Negative for rash. Neurological: Negative for headaches, focal weakness or numbness.  All systems  negative/normal/unremarkable except as stated in the HPI  ____________________________________________   PHYSICAL EXAM:  VITAL SIGNS: ED Triage Vitals  Enc Vitals Group     BP 04/16/20 1121 136/73     Pulse Rate 04/16/20 1121 71     Resp 04/16/20 1121 18     Temp 04/16/20 1121 98.3 F (36.8 C)     Temp Source 04/16/20 1121 Oral     SpO2 04/16/20 1121 100 %     Weight 04/16/20 1121 220 lb (99.8 kg)     Height 04/16/20 1121 5\' 5"  (1.651 m)     Head Circumference --      Peak Flow --      Pain Score 04/16/20 1132 2     Pain Loc --      Pain Edu? --      Excl. in GC? --     Constitutional: Alert and oriented. Well appearing and in no distress. Eyes: Conjunctivae are normal. Normal extraocular movements. Cardiovascular: Normal rate, regular rhythm. No murmurs, rubs, or gallops. Respiratory: Normal respiratory effort without tachypnea nor retractions. Breath sounds are clear and equal bilaterally. No wheezes/rales/rhonchi. Gastrointestinal: Soft and nontender. Normal bowel sounds Musculoskeletal: Nontender with normal range of motion in extremities. No lower extremity tenderness nor edema. Neurologic:  Normal speech and language. No gross focal neurologic deficits are appreciated.  Skin:  Skin is warm, dry and intact. No rash noted. Psychiatric: Mood and affect are normal. Speech and behavior are normal.  ____________________________________________  ED COURSE:  As part of my medical decision making, I reviewed the following data within the electronic MEDICAL RECORD NUMBER History obtained from family if available, nursing notes, old chart and ekg, as well as notes from  prior ED visits. Patient presented for chest pain and shortness of breath, we will assess with labs and imaging as indicated at this time.   Procedures  Susan Cuevas was evaluated in Emergency Department on 04/16/2020 for the symptoms described in the history of present illness. She was evaluated in the context of  the global COVID-19 pandemic, which necessitated consideration that the patient might be at risk for infection with the SARS-CoV-2 virus that causes COVID-19. Institutional protocols and algorithms that pertain to the evaluation of patients at risk for COVID-19 are in a state of rapid change based on information released by regulatory bodies including the CDC and federal and state organizations. These policies and algorithms were followed during the patient's care in the ED.  ____________________________________________   LABS (pertinent positives/negatives)  Labs Reviewed  COMPREHENSIVE METABOLIC PANEL - Abnormal; Notable for the following components:      Result Value   Glucose, Bld 105 (*)    AST 14 (*)    All other components within normal limits  PROTIME-INR  BRAIN NATRIURETIC PEPTIDE  TROPONIN I (HIGH SENSITIVITY)    RADIOLOGY Images were viewed by me  CTA chest IMPRESSION: Negative for significant acute pulmonary embolus by CTA.  No other acute intrathoracic finding.  Other ancillary findings as above ____________________________________________   DIFFERENTIAL DIAGNOSIS   Musculoskeletal pain, spasm, pleurisy, pneumonia, COVID-19, PE  FINAL ASSESSMENT AND PLAN  Chest pain   Plan: The patient had presented for right-sided chest pain with an outpatient elevated D-dimer. Patient's labs are unremarkable. Patient's imaging did not reveal any acute process.  She is cleared for outpatient follow-up.   Laurence Aly, MD    Note: This note was generated in part or whole with voice recognition software. Voice recognition is usually quite accurate but there are transcription errors that can and very often do occur. I apologize for any typographical errors that were not detected and corrected.     Earleen Newport, MD 04/16/20 1453

## 2020-04-16 NOTE — ED Triage Notes (Addendum)
Pt arrives POV from home for reports of shob and right sided chest pain that started yesterday. Pt seen at [facility] clinic and was told d-dimer was elevated. Pt states she does not want to do an EKG because this was just done at [facility] this morning. Pt speaking in complete sentences without difficulty and does not appear to be in acute distress. Pt d-dimer at [facility] was 540.55

## 2020-04-16 NOTE — ED Notes (Signed)
Pt labs and presentation discussed with Dr. Erma Heritage, EKG signed that pt brought over by Dr. Erma Heritage. Verbal given to draw CMP and PT-INR

## 2021-02-24 ENCOUNTER — Other Ambulatory Visit
Admission: RE | Admit: 2021-02-24 | Discharge: 2021-02-24 | Disposition: A | Discharge status: Home / Self Care | Payer: 59 | Source: Ambulatory Visit | Attending: Family Medicine | Admitting: Family Medicine

## 2021-02-24 DIAGNOSIS — R0602 Shortness of breath: Secondary | ICD-10-CM | POA: Diagnosis present

## 2021-02-24 DIAGNOSIS — R079 Chest pain, unspecified: Secondary | ICD-10-CM | POA: Diagnosis present

## 2021-02-24 LAB — D-DIMER, QUANTITATIVE: D-Dimer, Quant: 0.41 ug/mL-FEU (ref 0.00–0.50)

## 2021-02-24 LAB — TROPONIN I (HIGH SENSITIVITY): Troponin I (High Sensitivity): 4 ng/L (ref <18)

## 2022-01-28 ENCOUNTER — Other Ambulatory Visit: Payer: Self-pay | Admitting: Internal Medicine

## 2022-01-28 DIAGNOSIS — Z1231 Encounter for screening mammogram for malignant neoplasm of breast: Secondary | ICD-10-CM

## 2022-03-06 ENCOUNTER — Ambulatory Visit
Admission: RE | Admit: 2022-03-06 | Discharge: 2022-03-06 | Disposition: A | Discharge status: Home / Self Care | Payer: Managed Care, Other (non HMO) | Source: Ambulatory Visit | Attending: Internal Medicine | Admitting: Internal Medicine

## 2022-03-06 DIAGNOSIS — Z1231 Encounter for screening mammogram for malignant neoplasm of breast: Secondary | ICD-10-CM | POA: Insufficient documentation

## 2023-04-27 ENCOUNTER — Other Ambulatory Visit: Payer: Self-pay | Admitting: Family

## 2023-04-27 MED ORDER — TRIAMCINOLONE ACETONIDE 0.5 % EX CREA: 1.0000 | TOPICAL_CREAM | Freq: Three times a day (TID) | CUTANEOUS | 0 refills | Status: AC

## 2023-04-27 MED ORDER — PREDNISONE 10 MG (21) PO TBPK: ORAL_TABLET | ORAL | 0 refills | Status: DC

## 2023-04-28 ENCOUNTER — Other Ambulatory Visit: Payer: Self-pay

## 2023-04-28 MED ORDER — PREDNISONE 10 MG (21) PO TBPK: ORAL_TABLET | ORAL | 0 refills | Status: AC

## 2023-06-18 ENCOUNTER — Other Ambulatory Visit: Payer: Self-pay | Admitting: Internal Medicine

## 2023-06-18 DIAGNOSIS — Z1231 Encounter for screening mammogram for malignant neoplasm of breast: Secondary | ICD-10-CM

## 2023-06-25 ENCOUNTER — Ambulatory Visit
Admission: RE | Admit: 2023-06-25 | Discharge: 2023-06-25 | Disposition: A | Discharge status: Home / Self Care | Payer: 59 | Source: Ambulatory Visit | Attending: Internal Medicine | Admitting: Internal Medicine

## 2023-06-25 DIAGNOSIS — Z1231 Encounter for screening mammogram for malignant neoplasm of breast: Secondary | ICD-10-CM | POA: Diagnosis not present

## 2023-08-09 IMAGING — MG MM DIGITAL SCREENING BILAT W/ TOMO AND CAD
8 series · 8 of 24 positions shown · non-contrast
Comparison: Previous exam(s).

CLINICAL DATA: Screening.

EXAM:
DIGITAL SCREENING BILATERAL MAMMOGRAM WITH TOMOSYNTHESIS AND CAD
TECHNIQUE: Bilateral screening digital craniocaudal and mediolateral oblique
mammograms were obtained. Bilateral screening digital breast
tomosynthesis was performed. The images were evaluated with
computer-aided detection.

[L MLO synth-2D]
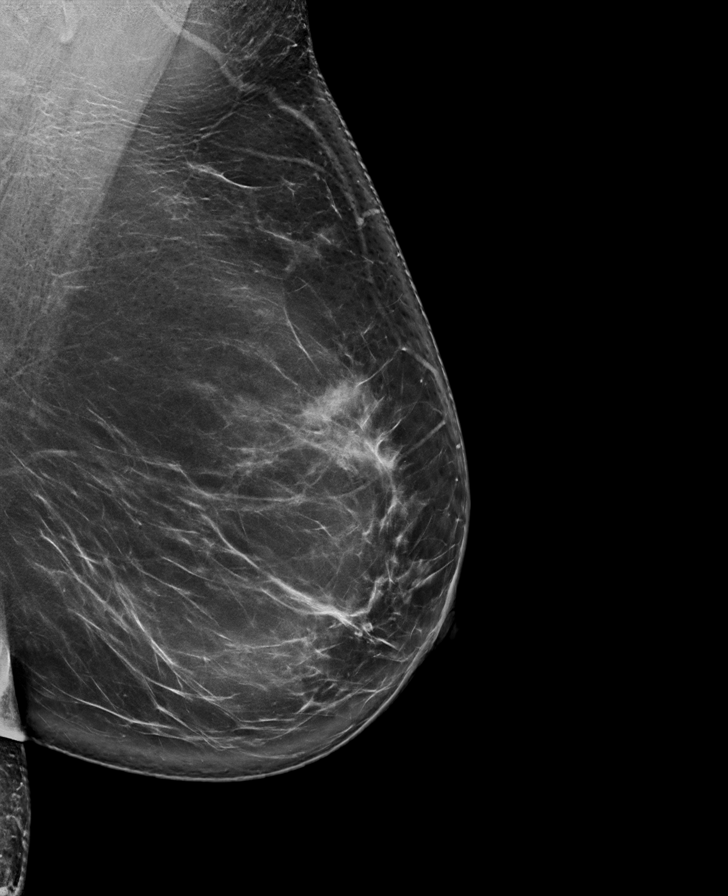

[R MLO synth-2D]
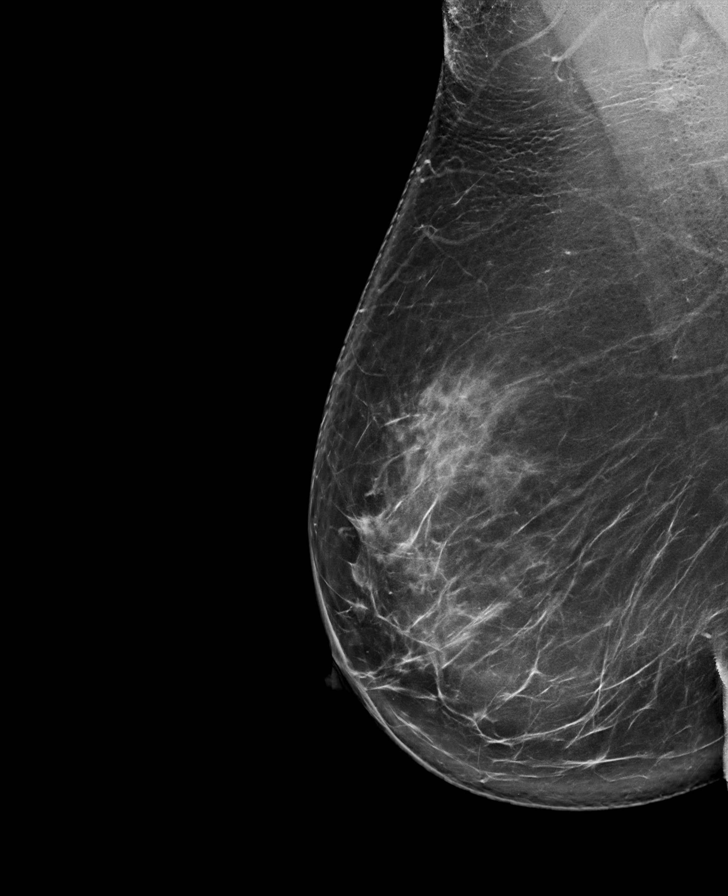

[L CC synth-2D]
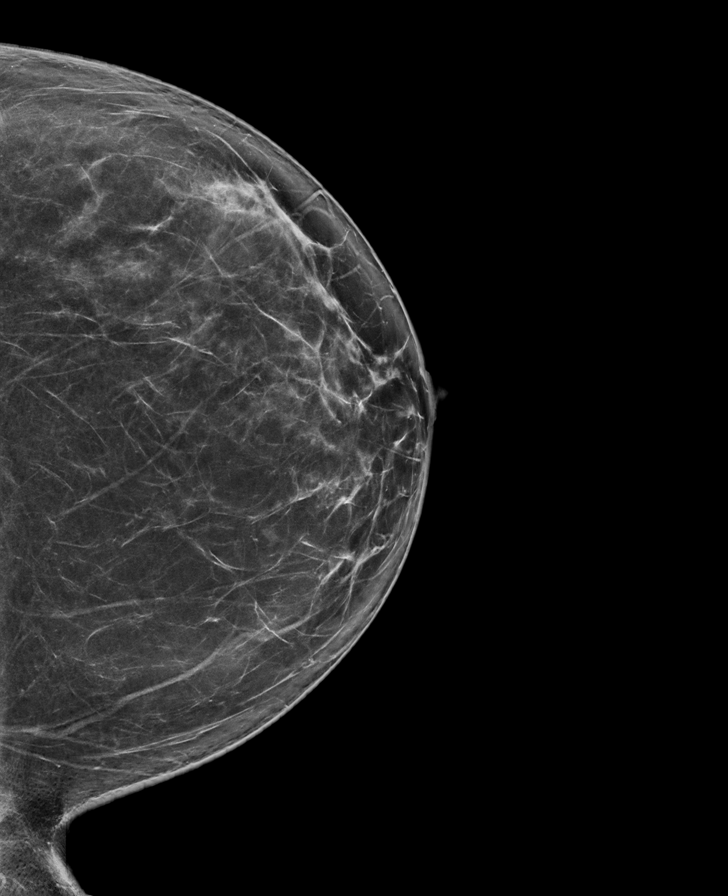

[R CC synth-2D]
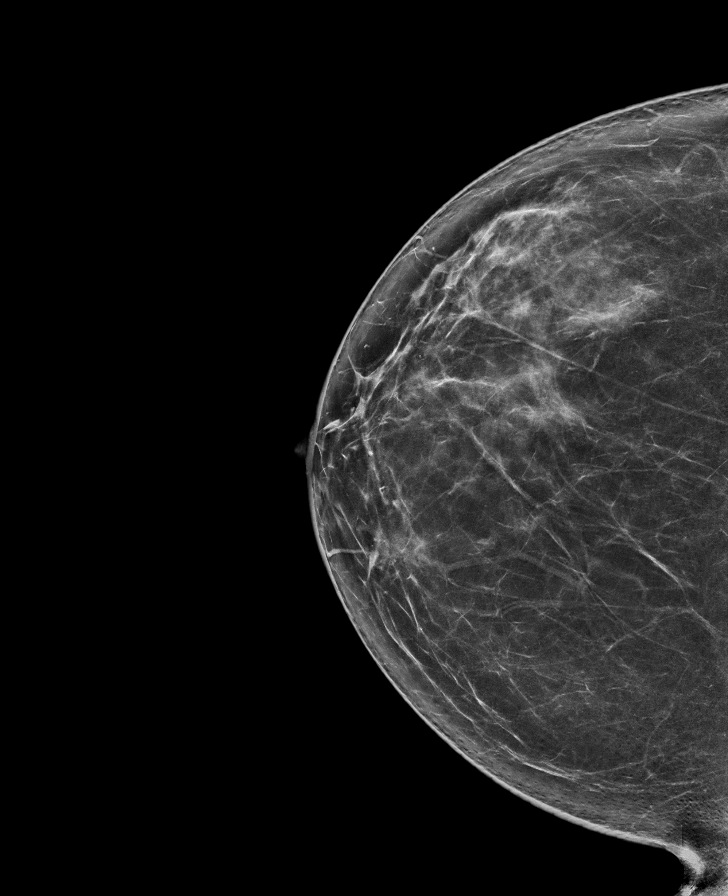

[L MLO tomo · tomo slice 54/107.0]
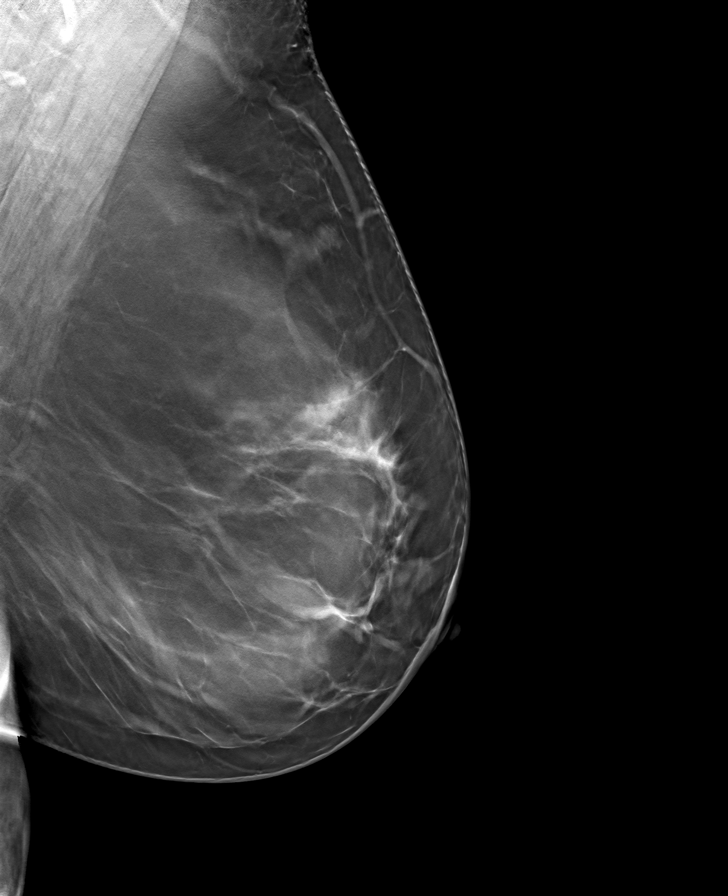

[R CC tomo · tomo slice 45/89.0]
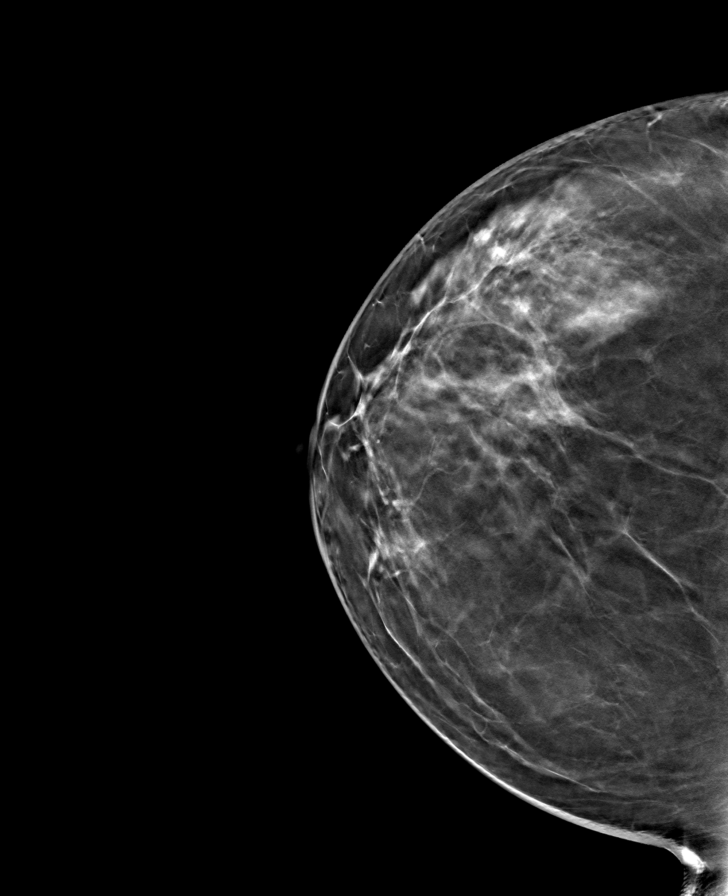

[L CC tomo · tomo slice 47/93.0]
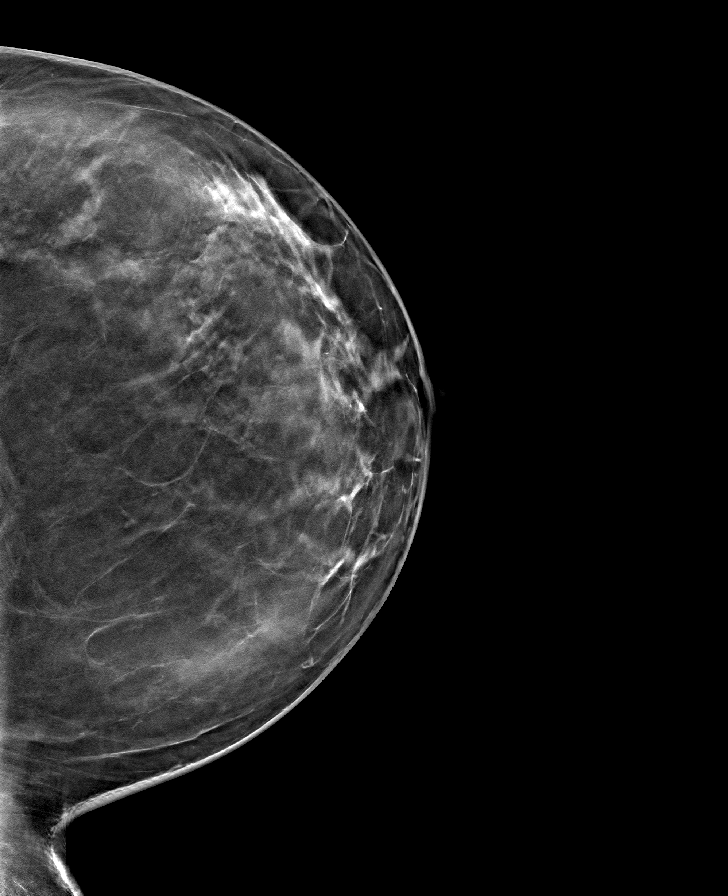

[R MLO tomo · tomo slice 55/108.0]
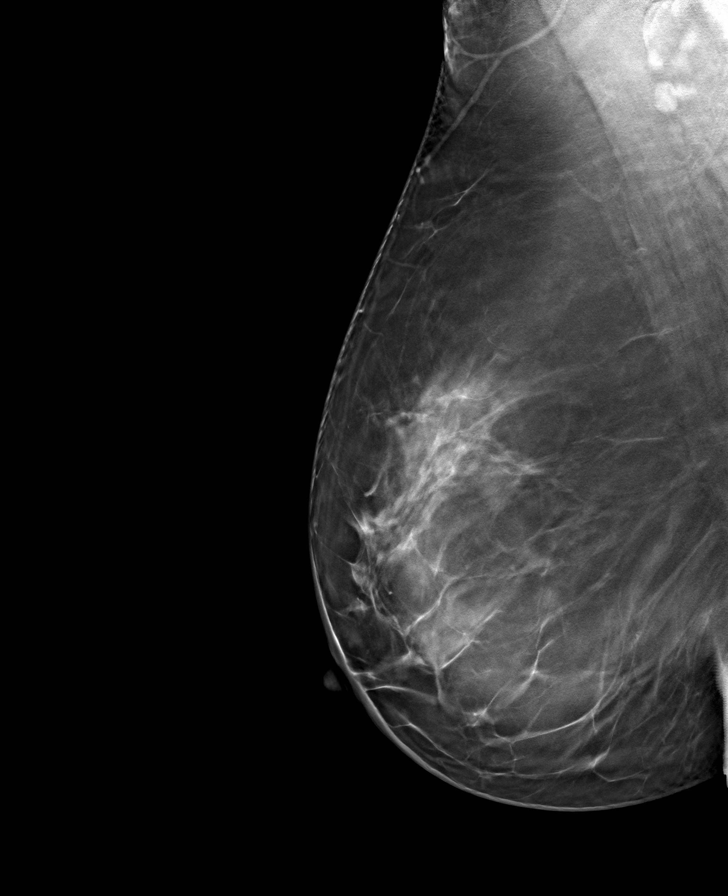

[8 of 24 positions shown; findings below may reference images not displayed]

ACR Breast Density Category c: The breast tissue is heterogeneously
dense, which may obscure small masses.
FINDINGS: There are no findings suspicious for malignancy.
IMPRESSION: No mammographic evidence of malignancy. A result letter of this
screening mammogram will be mailed directly to the patient.

RECOMMENDATION:
Screening mammogram in one year. (Code:Q3-W-BC3)

BI-RADS CATEGORY  1: Negative.

## 2023-10-22 ENCOUNTER — Ambulatory Visit: Payer: 59 | Admitting: Family

## 2023-11-17 DIAGNOSIS — E669 Obesity, unspecified: Secondary | ICD-10-CM | POA: Diagnosis not present

## 2023-11-17 DIAGNOSIS — Z6830 Body mass index (BMI) 30.0-30.9, adult: Secondary | ICD-10-CM | POA: Diagnosis not present

## 2023-11-17 DIAGNOSIS — M1990 Unspecified osteoarthritis, unspecified site: Secondary | ICD-10-CM | POA: Diagnosis not present

## 2023-11-17 DIAGNOSIS — Z008 Encounter for other general examination: Secondary | ICD-10-CM | POA: Diagnosis not present

## 2023-11-17 DIAGNOSIS — F331 Major depressive disorder, recurrent, moderate: Secondary | ICD-10-CM | POA: Diagnosis not present

## 2023-11-17 DIAGNOSIS — G4700 Insomnia, unspecified: Secondary | ICD-10-CM | POA: Diagnosis not present

## 2023-11-17 DIAGNOSIS — E039 Hypothyroidism, unspecified: Secondary | ICD-10-CM | POA: Diagnosis not present

## 2023-11-17 DIAGNOSIS — N182 Chronic kidney disease, stage 2 (mild): Secondary | ICD-10-CM | POA: Diagnosis not present

## 2024-01-03 DIAGNOSIS — E785 Hyperlipidemia, unspecified: Secondary | ICD-10-CM | POA: Diagnosis not present

## 2024-01-03 DIAGNOSIS — M2550 Pain in unspecified joint: Secondary | ICD-10-CM | POA: Diagnosis not present

## 2024-01-03 DIAGNOSIS — R202 Paresthesia of skin: Secondary | ICD-10-CM | POA: Diagnosis not present

## 2024-01-03 DIAGNOSIS — E039 Hypothyroidism, unspecified: Secondary | ICD-10-CM | POA: Diagnosis not present

## 2024-01-03 DIAGNOSIS — G4733 Obstructive sleep apnea (adult) (pediatric): Secondary | ICD-10-CM | POA: Diagnosis not present

## 2024-01-10 DIAGNOSIS — Z131 Encounter for screening for diabetes mellitus: Secondary | ICD-10-CM | POA: Diagnosis not present

## 2024-01-10 DIAGNOSIS — Z780 Asymptomatic menopausal state: Secondary | ICD-10-CM | POA: Diagnosis not present

## 2024-01-10 DIAGNOSIS — G4733 Obstructive sleep apnea (adult) (pediatric): Secondary | ICD-10-CM | POA: Diagnosis not present

## 2024-01-10 DIAGNOSIS — F3342 Major depressive disorder, recurrent, in full remission: Secondary | ICD-10-CM | POA: Diagnosis not present

## 2024-01-10 DIAGNOSIS — M4726 Other spondylosis with radiculopathy, lumbar region: Secondary | ICD-10-CM | POA: Diagnosis not present

## 2024-01-10 DIAGNOSIS — Z2821 Immunization not carried out because of patient refusal: Secondary | ICD-10-CM | POA: Diagnosis not present

## 2024-01-10 DIAGNOSIS — E785 Hyperlipidemia, unspecified: Secondary | ICD-10-CM | POA: Diagnosis not present

## 2024-01-10 DIAGNOSIS — Z86718 Personal history of other venous thrombosis and embolism: Secondary | ICD-10-CM | POA: Diagnosis not present

## 2024-01-10 DIAGNOSIS — Z0000 Encounter for general adult medical examination without abnormal findings: Secondary | ICD-10-CM | POA: Diagnosis not present

## 2024-01-10 DIAGNOSIS — E039 Hypothyroidism, unspecified: Secondary | ICD-10-CM | POA: Diagnosis not present

## 2024-01-25 DIAGNOSIS — Z780 Asymptomatic menopausal state: Secondary | ICD-10-CM | POA: Diagnosis not present

## 2024-02-22 DIAGNOSIS — R7303 Prediabetes: Secondary | ICD-10-CM | POA: Diagnosis not present

## 2024-02-22 DIAGNOSIS — E039 Hypothyroidism, unspecified: Secondary | ICD-10-CM | POA: Diagnosis not present

## 2024-02-22 DIAGNOSIS — F330 Major depressive disorder, recurrent, mild: Secondary | ICD-10-CM | POA: Diagnosis not present

## 2024-02-22 DIAGNOSIS — G4733 Obstructive sleep apnea (adult) (pediatric): Secondary | ICD-10-CM | POA: Diagnosis not present

## 2024-02-22 DIAGNOSIS — E669 Obesity, unspecified: Secondary | ICD-10-CM | POA: Diagnosis not present

## 2024-02-22 DIAGNOSIS — Z6833 Body mass index (BMI) 33.0-33.9, adult: Secondary | ICD-10-CM | POA: Diagnosis not present

## 2024-02-22 DIAGNOSIS — Z008 Encounter for other general examination: Secondary | ICD-10-CM | POA: Diagnosis not present

## 2024-03-03 DIAGNOSIS — G4733 Obstructive sleep apnea (adult) (pediatric): Secondary | ICD-10-CM | POA: Diagnosis not present

## 2024-07-03 DIAGNOSIS — E785 Hyperlipidemia, unspecified: Secondary | ICD-10-CM | POA: Diagnosis not present

## 2024-07-03 DIAGNOSIS — Z131 Encounter for screening for diabetes mellitus: Secondary | ICD-10-CM | POA: Diagnosis not present

## 2024-07-03 DIAGNOSIS — Z0000 Encounter for general adult medical examination without abnormal findings: Secondary | ICD-10-CM | POA: Diagnosis not present

## 2024-07-03 DIAGNOSIS — E039 Hypothyroidism, unspecified: Secondary | ICD-10-CM | POA: Diagnosis not present

## 2024-07-03 DIAGNOSIS — G4733 Obstructive sleep apnea (adult) (pediatric): Secondary | ICD-10-CM | POA: Diagnosis not present

## 2024-07-10 DIAGNOSIS — E785 Hyperlipidemia, unspecified: Secondary | ICD-10-CM | POA: Diagnosis not present

## 2024-07-10 DIAGNOSIS — F3342 Major depressive disorder, recurrent, in full remission: Secondary | ICD-10-CM | POA: Diagnosis not present

## 2024-07-10 DIAGNOSIS — M4726 Other spondylosis with radiculopathy, lumbar region: Secondary | ICD-10-CM | POA: Diagnosis not present

## 2024-07-10 DIAGNOSIS — R7303 Prediabetes: Secondary | ICD-10-CM | POA: Diagnosis not present

## 2024-07-10 DIAGNOSIS — G4733 Obstructive sleep apnea (adult) (pediatric): Secondary | ICD-10-CM | POA: Diagnosis not present

## 2024-07-10 DIAGNOSIS — M353 Polymyalgia rheumatica: Secondary | ICD-10-CM | POA: Diagnosis not present

## 2024-07-10 DIAGNOSIS — M549 Dorsalgia, unspecified: Secondary | ICD-10-CM | POA: Diagnosis not present

## 2024-07-10 DIAGNOSIS — G8929 Other chronic pain: Secondary | ICD-10-CM | POA: Diagnosis not present

## 2024-07-10 DIAGNOSIS — E039 Hypothyroidism, unspecified: Secondary | ICD-10-CM | POA: Diagnosis not present

## 2024-07-10 DIAGNOSIS — M5489 Other dorsalgia: Secondary | ICD-10-CM | POA: Diagnosis not present

## 2024-07-14 ENCOUNTER — Other Ambulatory Visit: Payer: Self-pay | Admitting: Internal Medicine

## 2024-07-14 DIAGNOSIS — S22060A Wedge compression fracture of T7-T8 vertebra, initial encounter for closed fracture: Secondary | ICD-10-CM

## 2024-07-18 ENCOUNTER — Ambulatory Visit
Admission: RE | Admit: 2024-07-18 | Discharge: 2024-07-18 | Disposition: A | Discharge status: Home / Self Care | Source: Ambulatory Visit | Attending: Internal Medicine | Admitting: Internal Medicine

## 2024-07-18 DIAGNOSIS — M4804 Spinal stenosis, thoracic region: Secondary | ICD-10-CM | POA: Diagnosis not present

## 2024-07-18 DIAGNOSIS — M47814 Spondylosis without myelopathy or radiculopathy, thoracic region: Secondary | ICD-10-CM | POA: Diagnosis not present

## 2024-07-18 DIAGNOSIS — R29890 Loss of height: Secondary | ICD-10-CM | POA: Diagnosis not present

## 2024-07-18 DIAGNOSIS — S22060A Wedge compression fracture of T7-T8 vertebra, initial encounter for closed fracture: Secondary | ICD-10-CM | POA: Diagnosis not present

## 2024-07-18 DIAGNOSIS — R609 Edema, unspecified: Secondary | ICD-10-CM | POA: Diagnosis not present

## 2024-07-31 DIAGNOSIS — M25551 Pain in right hip: Secondary | ICD-10-CM | POA: Diagnosis not present

## 2024-07-31 DIAGNOSIS — G8929 Other chronic pain: Secondary | ICD-10-CM | POA: Diagnosis not present

## 2024-08-08 DIAGNOSIS — M25551 Pain in right hip: Secondary | ICD-10-CM | POA: Diagnosis not present

## 2024-08-08 DIAGNOSIS — M5010 Cervical disc disorder with radiculopathy, unspecified cervical region: Secondary | ICD-10-CM | POA: Diagnosis not present

## 2024-08-08 DIAGNOSIS — M4726 Other spondylosis with radiculopathy, lumbar region: Secondary | ICD-10-CM | POA: Diagnosis not present

## 2024-08-08 DIAGNOSIS — M5116 Intervertebral disc disorders with radiculopathy, lumbar region: Secondary | ICD-10-CM | POA: Diagnosis not present

## 2024-08-08 DIAGNOSIS — M5114 Intervertebral disc disorders with radiculopathy, thoracic region: Secondary | ICD-10-CM | POA: Diagnosis not present

## 2024-08-08 DIAGNOSIS — M4184 Other forms of scoliosis, thoracic region: Secondary | ICD-10-CM | POA: Diagnosis not present

## 2024-08-08 DIAGNOSIS — M4727 Other spondylosis with radiculopathy, lumbosacral region: Secondary | ICD-10-CM | POA: Diagnosis not present

## 2024-08-24 DIAGNOSIS — M5451 Vertebrogenic low back pain: Secondary | ICD-10-CM | POA: Diagnosis not present

## 2024-08-31 DIAGNOSIS — M5451 Vertebrogenic low back pain: Secondary | ICD-10-CM | POA: Diagnosis not present

## 2024-09-08 DIAGNOSIS — M5451 Vertebrogenic low back pain: Secondary | ICD-10-CM | POA: Diagnosis not present

## 2024-09-14 DIAGNOSIS — M5451 Vertebrogenic low back pain: Secondary | ICD-10-CM | POA: Diagnosis not present

## 2024-11-07 ENCOUNTER — Other Ambulatory Visit: Payer: Self-pay | Admitting: Internal Medicine

## 2024-11-07 DIAGNOSIS — R2232 Localized swelling, mass and lump, left upper limb: Secondary | ICD-10-CM

## 2024-11-10 ENCOUNTER — Ambulatory Visit: Admission: RE | Admit: 2024-11-10 | Discharge: 2024-11-10 | Attending: Internal Medicine | Admitting: Internal Medicine

## 2024-11-10 DIAGNOSIS — R911 Solitary pulmonary nodule: Secondary | ICD-10-CM | POA: Diagnosis not present

## 2024-11-10 DIAGNOSIS — R932 Abnormal findings on diagnostic imaging of liver and biliary tract: Secondary | ICD-10-CM | POA: Insufficient documentation

## 2024-11-10 DIAGNOSIS — R2232 Localized swelling, mass and lump, left upper limb: Secondary | ICD-10-CM | POA: Diagnosis present

## 2024-11-10 HISTORY — DX: Type 2 diabetes mellitus without complications: E11.9

## 2024-11-10 MED ORDER — IOHEXOL 300 MG/ML  SOLN
75.0000 mL | Freq: Once | INTRAMUSCULAR | Status: AC | PRN
  Administered 2024-11-10: 75 mL via INTRAVENOUS
